# Patient Record
Sex: Male | Born: 1955 | Race: White | Hispanic: No | Marital: Married | Smoking: Former smoker
Health system: Southern US, Community
[De-identification: ages and names within clinical notes are randomized; demographics above are authoritative.]

## PROBLEM LIST (undated history)

## (undated) DIAGNOSIS — G4730 Sleep apnea, unspecified: Secondary | ICD-10-CM

## (undated) DIAGNOSIS — Z8719 Personal history of other diseases of the digestive system: Secondary | ICD-10-CM

## (undated) DIAGNOSIS — E039 Hypothyroidism, unspecified: Secondary | ICD-10-CM

## (undated) DIAGNOSIS — F0390 Unspecified dementia without behavioral disturbance: Secondary | ICD-10-CM

## (undated) DIAGNOSIS — M1990 Unspecified osteoarthritis, unspecified site: Secondary | ICD-10-CM

## (undated) DIAGNOSIS — C801 Malignant (primary) neoplasm, unspecified: Secondary | ICD-10-CM

## (undated) DIAGNOSIS — K219 Gastro-esophageal reflux disease without esophagitis: Secondary | ICD-10-CM

## (undated) DIAGNOSIS — E785 Hyperlipidemia, unspecified: Secondary | ICD-10-CM

## (undated) DIAGNOSIS — I10 Essential (primary) hypertension: Secondary | ICD-10-CM

## (undated) DIAGNOSIS — K279 Peptic ulcer, site unspecified, unspecified as acute or chronic, without hemorrhage or perforation: Secondary | ICD-10-CM

## (undated) HISTORY — PX: ROTATOR CUFF REPAIR: SHX139

## (undated) HISTORY — PX: HIATAL HERNIA REPAIR: SHX195

## (undated) HISTORY — PX: UPPER GASTROINTESTINAL ENDOSCOPY: SHX188

## (undated) HISTORY — PX: THYROIDECTOMY: SHX17

## (undated) HISTORY — PX: JOINT REPLACEMENT: SHX530

---

## 1973-09-13 HISTORY — PX: HIATAL HERNIA REPAIR: SHX195

## 2004-09-13 HISTORY — PX: COLONOSCOPY: SHX174

## 2005-01-14 ENCOUNTER — Ambulatory Visit: Payer: Self-pay | Admitting: Gastroenterology

## 2006-12-15 ENCOUNTER — Ambulatory Visit: Payer: Self-pay | Admitting: Family Medicine

## 2007-09-14 HISTORY — PX: THYROIDECTOMY: SHX17

## 2007-11-30 ENCOUNTER — Ambulatory Visit: Payer: Self-pay | Admitting: Family Medicine

## 2008-05-21 ENCOUNTER — Ambulatory Visit: Payer: Self-pay | Admitting: Otolaryngology

## 2008-05-27 ENCOUNTER — Ambulatory Visit: Payer: Self-pay | Admitting: Otolaryngology

## 2008-06-10 ENCOUNTER — Ambulatory Visit: Payer: Self-pay | Admitting: Otolaryngology

## 2008-07-09 ENCOUNTER — Inpatient Hospital Stay: Payer: Self-pay | Admitting: Endocrinology

## 2008-09-13 HISTORY — PX: TOTAL HIP ARTHROPLASTY: SHX124

## 2010-09-13 HISTORY — PX: ROTATOR CUFF REPAIR: SHX139

## 2010-12-19 ENCOUNTER — Emergency Department: Payer: Self-pay | Admitting: Emergency Medicine

## 2011-07-23 ENCOUNTER — Ambulatory Visit: Payer: Self-pay | Admitting: Family

## 2011-08-19 ENCOUNTER — Ambulatory Visit: Payer: Self-pay | Admitting: Family

## 2012-08-06 LAB — BASIC METABOLIC PANEL
BUN: 18 mg/dL (ref 7–18)
Calcium, Total: 9.1 mg/dL (ref 8.5–10.1)
EGFR (African American): >60
EGFR (Non-African Amer.): >60
Glucose: 98 mg/dL (ref 65–99)
Osmolality: 283 (ref 275–301)
Sodium: 141 mmol/L (ref 136–145)

## 2012-08-06 LAB — CBC
HCT: 40.6 % (ref 40.0–52.0)
MCHC: 35.6 g/dL (ref 32.0–36.0)
MCV: 92 fL (ref 80–100)
Platelet: 217 10*3/uL (ref 150–440)
RDW: 12 % (ref 11.5–14.5)

## 2012-08-06 LAB — CK TOTAL AND CKMB (NOT AT ARMC): CK-MB: 2.3 ng/mL (ref 0.5–3.6)

## 2012-08-07 ENCOUNTER — Observation Stay: Payer: Self-pay | Admitting: Internal Medicine

## 2012-08-07 LAB — CK TOTAL AND CKMB (NOT AT ARMC)
CK, Total: 83 U/L (ref 35–232)
CK, Total: 73 U/L (ref 35–232)
CK-MB: 1.3 ng/mL (ref 0.5–3.6)

## 2012-08-07 LAB — MAGNESIUM: Magnesium: 2.1 mg/dL

## 2012-08-07 LAB — TROPONIN I: Troponin-I: <0.02 ng/mL

## 2012-08-07 LAB — TSH: Thyroid Stimulating Horm: 0.072 u[IU]/mL — ABNORMAL LOW

## 2013-07-02 ENCOUNTER — Ambulatory Visit: Payer: Self-pay | Admitting: Internal Medicine

## 2013-07-25 ENCOUNTER — Ambulatory Visit: Payer: Self-pay | Admitting: Internal Medicine

## 2014-08-09 ENCOUNTER — Ambulatory Visit: Payer: Self-pay | Admitting: Otolaryngology

## 2014-12-31 NOTE — Discharge Summary (Signed)
PATIENT NAME:  Dylan Chen, Dylan Chen MR#:  [id number] DATE OF BIRTH:  25-Sep-1955  DATE OF ADMISSION:  08/07/2012 DATE OF DISCHARGE:  08/07/2012  PRIMARY CARE PHYSICIAN: Dr. Dorann Ou   REASON FOR ADMISSION: Chest pain.    DISCHARGE DIAGNOSES:  1. Atypical chest pain.  2. Hyperlipidemia.   CONDITION: Stable.   CODE STATUS: FULL CODE.   HOME MEDICATIONS: Please refer to the [facility] physician discharge medication list.   DIET: Low fat, low cholesterol diet.   ACTIVITY: As tolerated.   FOLLOW-UP CARE: Follow-up with PCP within 1 to 2 weeks.   HOSPITAL COURSE: The patient is a 59 year old Caucasian male with a history of hyperlipidemia, gastroesophageal reflux disease, thyroid cancer, and osteoarthritis who presented to the ED with chest pain for 30 minutes which became progressively worse so he came to the ED for further evaluation. For detailed history and physical examination, please refer to the admission note dictated by Dr. Pearletha Furl. The patient's chest x-ray was unremarkable. Troponin level was negative twice. The patient's chemistry is unremarkable. The patient underwent stress test today which is negative. The patient had no chest pain after admission but has mild tenderness on the left chest wall which is atypical so the patient is clinically stable. Vital signs stable. Physical examination unremarkable. He will be discharged to home today.   I discussed the patient's discharge plan with the patient and the patient's wife.   TIME SPENT: About 32 minutes.   ____________________________ Demetrios Loll, MD qc:drc D: 08/07/2012 17:24:05 ET T: 08/08/2012 13:32:34 ET JOB#: [id number]  cc: Demetrios Loll, MD, <Dictator> Demetrios Loll MD ELECTRONICALLY SIGNED 08/08/2012 17:08

## 2015-01-03 NOTE — H&P (Signed)
PATIENT NAME:  Dylan Chen, Dylan Chen MR#:  [id number] DATE OF BIRTH:  1956-05-15  DATE OF ADMISSION:  08/07/2012  PRIMARY CARE PROVIDER: Dr. Lavera Guise   ER PHYSICIAN: Dr. Renard Hamper    ADMITTING PHYSICIAN: Dr. Pearletha Furl    PRESENTING COMPLAINT: Chest pain, recurrent, for the last three days.   HISTORY OF PRESENT ILLNESS: The patient is a 59 year old male who was in his usual state of health until about three days when he started having recurrent left-sided chest pain. Pain was intermittent in nature, duration more than 30 minutes. Initially intensity was 5/10 but became progressively worse this evening with intensity of 9/10 associated with crying as per the patient's wife for which he presented to the Emergency Room. Pain is dull in character radiating up to the left arm and left side of the chest. Denies any PND, orthopnea, or pedal edema. No nausea or vomiting. No palpitations, diaphoresis, loss of consciousness. No history of trauma to the area or rash to the area of the chest. Denies any associated pain with activity or deep inspiration. For this he presented to the Emergency Room where he received medication which relieved the pain and he was referred to the hospitalist. Work-up so far has been negative except for EKG which showed sinus bradycardia, rate of 55. Chest x-ray is nondiagnostic. Blood pressure was normal with normal cardiac enzymes.   REVIEW OF SYSTEMS: CONSTITUTIONAL: Denies any fever, fatigue, weakness, or weight loss. EYES: No blurred vision, redness, or discharge. ENT: No tinnitus, epistaxis, difficulty swallowing. RESPIRATORY: No cough or shortness of breath. CARDIOVASCULAR: Positive for the chest pain but no arrhythmia, exertional dyspnea, palpitations, or syncope. GI: No nausea, vomiting, or diarrhea. No abdominal pain. The patient is currently hungry and wants to eat. No change in bowel habits. GU: No dysuria, frequency, or incontinence. ENDOCRINE: No polyuria, polydipsia, heat or cold  intolerance, or excessive thirst. HEMATOLOGIC: No anemia, easy bruising, bleeding, or swollen glands. SKIN: No rashes, change in hair or skin texture. MUSCULOSKELETAL: No joint pain, redness, swelling, or limited activity. NEUROLOGIC: No numbness, vertigo, dementia, or headache. PSYCHIATRIC: No anxiety or depression.    PAST MEDICAL HISTORY:  1. Gastroesophageal reflux disease. 2. Hyperlipidemia.  3. Osteoarthritis.  4. History of thyroid cancer, status post resection in 2009.   PAST SURGICAL HISTORY:  1. Right rotator cuff surgery. 2. Left hip replacement.  3. Thyroidectomy.  4. Hernia repair.   SOCIAL HISTORY: Lives at home with his family. Used to work in a Writer as dye cast. No alcohol, tobacco, or recreational drug use. Quit smoking more than 20 years ago.   FAMILY HISTORY: Positive for diabetes in maternal grandmother, otherwise negative.   ALLERGIES: Codeine makes him confused, not a true allergy.   HOME MEDICATIONS:  1. Aspirin 81 mg daily.  2. Ibuprofen 200 mg p.r.n. daily.  3. Lipitor, unknown dose, 1 tablet daily.  4. Omeprazole, unknown dose, daily.  5. Synthroid 175 mcg daily. 6. Multivitamin 1 tablet daily.  7. Calcium with Vitamin C 1 tablet daily.   PHYSICAL EXAMINATION:   VITAL SIGNS: Temperature 97.5, pulse 55, respiratory rate 18, blood pressure 107/72, sats 97 on room air.   GENERAL: Middle-aged male lying on the gurney awake, alert, oriented to time, place and person in no overt distress.   HEENT: Atraumatic, normocephalic. Pupils equal, reactive to light and accommodation. Extraocular movements intact. Mucous membranes pink, moist.   NECK: Supple. No JV distention.   CHEST: Good air entry. Clear to auscultation. No chest wall  tenderness.   HEART: Regular rate and rhythm. No murmur.   ABDOMEN: Full, moves with respiration, nontender. Bowel sounds normoactive. No organomegaly.   EXTREMITIES: No edema, clubbing, or deformity.   NEUROLOGICAL:  No focal motor or sensory deficits.   PSYCH: Affect appropriate to situation.   LABORATORY, DIAGNOSTIC, AND RADIOLOGICAL DATA: EKG shows sinus bradycardia, rate of 55. No acute ST-T wave changes.   Chest x-ray no obvious infiltrate.   CBC white count 5, hemoglobin 14, platelets 217. Chemistry unremarkable. Creatinine 1.15, potassium 3.5, glucose 98. CK 118. Troponin negative.   IMPRESSION:  1. Chest pain, rule out arrhythmia versus acute coronary syndrome, atypical presentation.  2. Gastroesophageal reflux disease, stable.  3. Hyperlipidemia, stable. 4. Osteoarthritis, stable.  5. History of thyroid cancer.   PLAN:  1. Admit to general medical floor under observation status for serial cardiac enzymes.  2. Check TSH, magnesium, fasting lipid profile, telemonitoring.  3. Stress test in a.m. if enzymes negative.  4. Aspirin p.r.n.  5. Nitroglycerin and morphine. 6. Lovenox for anticoagulation.  7. GI prophylaxis with Protonix.  8. DVT prophylaxis and Lovenox therapeutic dose at this time.  9. Check D-dimer.  CODE STATUS: FULL CODE.   TOTAL PATIENT CARE TIME: 55 minutes.   ____________________________ Jules Husbands Pearletha Furl, MD mia:drc D: 08/07/2012 03:15:35 ET T: 08/07/2012 09:14:21 ET JOB#: [id number]  cc: Daniesha Driver I. Pearletha Furl, MD, <Dictator> Cletis Athens, MD Carola Frost MD ELECTRONICALLY SIGNED 09/16/2012 23:53

## 2016-04-10 ENCOUNTER — Emergency Department
Admission: EM | Admit: 2016-04-10 | Discharge: 2016-04-10 | Disposition: A | Discharge status: Home / Self Care | Payer: PRIVATE HEALTH INSURANCE | Attending: Emergency Medicine | Admitting: Emergency Medicine

## 2016-04-10 DIAGNOSIS — Z87891 Personal history of nicotine dependence: Secondary | ICD-10-CM | POA: Diagnosis not present

## 2016-04-10 DIAGNOSIS — Y929 Unspecified place or not applicable: Secondary | ICD-10-CM | POA: Diagnosis not present

## 2016-04-10 DIAGNOSIS — Z85850 Personal history of malignant neoplasm of thyroid: Secondary | ICD-10-CM | POA: Insufficient documentation

## 2016-04-10 DIAGNOSIS — Y999 Unspecified external cause status: Secondary | ICD-10-CM | POA: Insufficient documentation

## 2016-04-10 DIAGNOSIS — W268XXA Contact with other sharp object(s), not elsewhere classified, initial encounter: Secondary | ICD-10-CM | POA: Diagnosis not present

## 2016-04-10 DIAGNOSIS — S59911A Unspecified injury of right forearm, initial encounter: Secondary | ICD-10-CM

## 2016-04-10 DIAGNOSIS — Y939 Activity, unspecified: Secondary | ICD-10-CM | POA: Diagnosis not present

## 2016-04-10 DIAGNOSIS — S50851A Superficial foreign body of right forearm, initial encounter: Secondary | ICD-10-CM | POA: Insufficient documentation

## 2016-04-10 HISTORY — DX: Malignant (primary) neoplasm, unspecified: C80.1

## 2016-04-10 HISTORY — DX: Hyperlipidemia, unspecified: E78.5

## 2016-04-10 MED ORDER — CLINDAMYCIN HCL 150 MG PO CAPS
300.0000 mg | ORAL_CAPSULE | Freq: Once | ORAL | Status: AC
  Administered 2016-04-10: 300 mg via ORAL
  Filled 2016-04-10: qty 2

## 2016-04-10 MED ORDER — CLINDAMYCIN HCL 300 MG PO CAPS: 300.0000 mg | ORAL_CAPSULE | Freq: Four times a day (QID) | ORAL | 0 refills | Status: DC

## 2016-04-10 MED ORDER — LIDOCAINE HCL (PF) 1 % IJ SOLN
5.0000 mL | Freq: Once | INTRAMUSCULAR | Status: AC
  Administered 2016-04-10: 5 mL
  Filled 2016-04-10: qty 5

## 2016-04-10 MED ORDER — TETANUS-DIPHTH-ACELL PERTUSSIS 5-2.5-18.5 LF-MCG/0.5 IM SUSP
0.5000 mL | Freq: Once | INTRAMUSCULAR | Status: AC
  Administered 2016-04-10: 0.5 mL via INTRAMUSCULAR
  Filled 2016-04-10: qty 0.5

## 2016-04-10 NOTE — ED Notes (Signed)

## 2016-04-10 NOTE — Discharge Instructions (Signed)
Take the antibiotics as directed. Keep the wound clean, dry, and covered. Return to the ED as needed for wound check.

## 2016-04-10 NOTE — ED Triage Notes (Signed)
Pt states he was fishing with his son and has 2 fish hocks in the right elbow.Dylan Chen

## 2016-04-10 NOTE — ED Provider Notes (Signed)
[facility] Emergency Department Provider Note ____________________________________________  Time seen: 1418  I have reviewed the triage vital signs and the nursing notes.  HISTORY  Chief Complaint  Foreign Body  HPI Dylan Chen is a 60 y.o. male presents to the ED accompanied by his wife for evaluation and management of a retained foreign body. The patient was at the [place] with his son when he was accidentally impaled to the right elbow with 2 treble hooks. He reports with 2 fishing hooks impaled in his right elbow superficially. He denies any other injury at this time. He is unclear of his current tetanus status.  Past Medical History:  Diagnosis Date  . Cancer ([place])    thyr  . Hyperlipemia     There are no active problems to display for this patient.   Past Surgical History:  Procedure Laterality Date  . JOINT REPLACEMENT    . ROTATOR CUFF REPAIR    . THYROIDECTOMY      Current Outpatient Rx  . Order #: [id number] Class: Print    Allergies Review of patient's allergies indicates no known allergies.  No family history on file.  Social History Social History  Substance Use Topics  . Smoking status: Former Research scientist (life sciences)  . Smokeless tobacco: Never Used  . Alcohol use No   Review of Systems  Constitutional: Negative for fever. Cardiovascular: Negative for chest pain. Respiratory: Negative for shortness of breath. Musculoskeletal: Negative for back pain. Skin: Negative for rash. Foreign body impaled superficially as above. Neurological: Negative for headaches, focal weakness or numbness. ____________________________________________  PHYSICAL EXAM:  VITAL SIGNS: ED Triage Vitals  Enc Vitals Group     BP 04/10/16 1558 137/79     Pulse Rate 04/10/16 1329 65     Resp 04/10/16 1329 16     Temp 04/10/16 1329 97.5 F (36.4 C)     Temp Source 04/10/16 1329 Oral     SpO2 04/10/16 1329 96 %     Weight 04/10/16 1329 172 lb (78 kg)   Height 04/10/16 1329 5\' 5"  (1.651 m)     Head Circumference --      Peak Flow --      Pain Score 04/10/16 1330 2     Pain Loc --      Pain Edu? --      Excl. in [place]? --    Constitutional: Alert and oriented. Well appearing and in no distress. Head: Normocephalic and atraumatic. Cardiovascular: Normal rate, regular rhythm.  Respiratory: Normal respiratory effort.  Musculoskeletal: Nontender with normal range of motion in all extremities.  Neurologic:  Normal gait without ataxia. Normal speech and language. No gross focal neurologic deficits are appreciated. Skin:  Skin is warm, dry and intact. No rash noted. Right elbow with 2 treble hooks noted. Each hook with a single barbed hook impaled under the skin. No active bleeding.  ____________________________________________  PROCEDURES  Tdap IM Clindamycin 300 mg PO  FOREIGN BODY REMOVAL Performed by: Melvenia Needles Authorized by: Melvenia Needles Consent: Verbal consent obtained. Risks and benefits: risks, benefits and alternatives were discussed Consent given by: parent/patient Patient identity confirmed: provided demographic data Prepped and Draped in normal fashion  FB Location: Right elbow  Foreign Body Identified: treble fishing hooks x 2  Removal Method: Push-through and cut barbed end  Local anesthesia: 1% lido w/o epi 3 ml total  Mechanism: electric ring cutter  Resolution: FB successfully removed  Patient tolerance: Patient tolerated the procedure  well with no immediate complications. ____________________________________________  INITIAL IMPRESSION / ASSESSMENT AND PLAN / ED COURSE  Patient with successful removal of 2 separate treble fishing hooks from the right elbow. Patient tolerated procedure well and is discharged with wound care instructions. He is placed prophylactically on clindamycin and his tetanus is boosted at this time. He will follow up with primary care provider or return to the ED  for acute signs of local infection.  Clinical Course   ____________________________________________  FINAL CLINICAL IMPRESSION(S) / ED DIAGNOSES  Final diagnoses:  Fish hook injury of right forearm, initial encounter     Melvenia Needles, PA-C 04/10/16 [place], MD 04/11/16 [phone]

## 2016-12-23 ENCOUNTER — Other Ambulatory Visit: Payer: Self-pay | Admitting: Family Medicine

## 2016-12-23 DIAGNOSIS — M7989 Other specified soft tissue disorders: Secondary | ICD-10-CM

## 2016-12-27 ENCOUNTER — Ambulatory Visit
Admission: RE | Admit: 2016-12-27 | Discharge: 2016-12-27 | Disposition: A | Discharge status: Home / Self Care | Payer: BLUE CROSS/BLUE SHIELD | Source: Ambulatory Visit | Attending: Family Medicine | Admitting: Family Medicine

## 2016-12-27 DIAGNOSIS — M7989 Other specified soft tissue disorders: Secondary | ICD-10-CM | POA: Diagnosis present

## 2016-12-30 ENCOUNTER — Other Ambulatory Visit: Payer: Self-pay | Admitting: Orthopedic Surgery

## 2016-12-30 DIAGNOSIS — M5416 Radiculopathy, lumbar region: Secondary | ICD-10-CM

## 2017-01-08 ENCOUNTER — Ambulatory Visit
Admission: RE | Admit: 2017-01-08 | Discharge: 2017-01-08 | Disposition: A | Discharge status: Home / Self Care | Payer: BLUE CROSS/BLUE SHIELD | Source: Ambulatory Visit | Attending: Orthopedic Surgery | Admitting: Orthopedic Surgery

## 2017-01-08 DIAGNOSIS — M5416 Radiculopathy, lumbar region: Secondary | ICD-10-CM | POA: Diagnosis present

## 2017-01-08 DIAGNOSIS — M5126 Other intervertebral disc displacement, lumbar region: Secondary | ICD-10-CM | POA: Insufficient documentation

## 2017-01-08 DIAGNOSIS — M5136 Other intervertebral disc degeneration, lumbar region: Secondary | ICD-10-CM | POA: Diagnosis present

## 2017-04-18 ENCOUNTER — Encounter: Payer: Self-pay | Admitting: *Deleted

## 2017-04-18 ENCOUNTER — Telehealth: Payer: Self-pay | Admitting: *Deleted

## 2017-04-18 NOTE — Telephone Encounter (Signed)
Left message for patient to call the office back. We got a referral from [place] office to see Dr.Byrnett. He has [place] which will be out of network benefits. We can still see him but let him know that he may have to pay higher out of pocket

## 2017-04-27 ENCOUNTER — Ambulatory Visit: Payer: Self-pay | Admitting: General Surgery

## 2017-05-09 ENCOUNTER — Ambulatory Visit: Payer: Self-pay | Admitting: General Surgery

## 2017-06-10 ENCOUNTER — Encounter
Admission: RE | Admit: 2017-06-10 | Discharge: 2017-06-10 | Disposition: A | Discharge status: Home / Self Care | Payer: BLUE CROSS/BLUE SHIELD | Source: Ambulatory Visit | Attending: Surgery | Admitting: Surgery

## 2017-06-10 DIAGNOSIS — R001 Bradycardia, unspecified: Secondary | ICD-10-CM | POA: Insufficient documentation

## 2017-06-10 DIAGNOSIS — I10 Essential (primary) hypertension: Secondary | ICD-10-CM | POA: Insufficient documentation

## 2017-06-10 DIAGNOSIS — Z01810 Encounter for preprocedural cardiovascular examination: Secondary | ICD-10-CM | POA: Insufficient documentation

## 2017-06-10 HISTORY — DX: Personal history of other diseases of the digestive system: Z87.19

## 2017-06-10 HISTORY — DX: Unspecified osteoarthritis, unspecified site: M19.90

## 2017-06-10 HISTORY — DX: Essential (primary) hypertension: I10

## 2017-06-10 HISTORY — DX: Hypothyroidism, unspecified: E03.9

## 2017-06-10 HISTORY — DX: Sleep apnea, unspecified: G47.30

## 2017-06-10 HISTORY — DX: Gastro-esophageal reflux disease without esophagitis: K21.9

## 2017-06-10 NOTE — Patient Instructions (Signed)
Your procedure is scheduled on: 06-16-17 THURSDAY Report to Same Day Surgery 2nd floor medical mall [facility] Entrance-take elevator on left to 2nd floor.  Check in with surgery information desk.) To find out your arrival time please call [phone] between 1PM - 3PM on 06-15-17 [facility]  Remember: Instructions that are not followed completely may result in serious medical risk, up to and including death, or upon the discretion of your surgeon and anesthesiologist your surgery may need to be rescheduled.    _x___ 1. Do not eat food after midnight the night before your procedure. NO GUM CHEWING OR HARD CANDIES.  You may drink clear liquids up to 2 hours before you are scheduled to arrive at the hospital for your procedure.  Do not drink clear liquids within 2 hours of your scheduled arrival to the hospital.  Clear liquids include  --Water or Apple juice without pulp  --Clear carbohydrate beverage such as ClearFast or Gatorade  --Black Coffee or Clear Tea (No milk, no creamers, do not add anything to the coffee or Tea  Type 1 and type 2 diabetics should only drink water.    __x__ 2. No Alcohol for 24 hours before or after surgery.   __x__3. No Smoking for 24 prior to surgery.   ____  4. Bring all medications with you on the day of surgery if instructed.    __x__ 5. Notify your doctor if there is any change in your medical condition     (cold, fever, infections).     Do not wear jewelry, make-up, hairpins, clips or nail polish.  Do not wear lotions, powders, or perfumes. You may wear deodorant.  Do not shave 48 hours prior to surgery. Men may shave face and neck.  Do not bring valuables to the hospital.    [facility] is not responsible for any belongings or valuables.               Contacts, dentures or bridgework may not be worn into surgery.  Leave your suitcase in the car. After surgery it may be brought to your room.  For patients admitted to the hospital, discharge time  is determined by your treatment team.   Patients discharged the day of surgery will not be allowed to drive home.  You will need someone to drive you home and stay with you the night of your procedure.    Please read over the following fact sheets that you were given:   [facility] Preparing for Surgery and or MRSA Information   _x___ Take anti-hypertensive listed below, cardiac, seizure, asthma,     anti-reflux and psychiatric medicines. These include:  1. SYNTHROID  2. ZANTAC  3.  4.  5.  6.  ____Fleets enema or Magnesium Citrate as directed.   _x___ Use CHG Soap or sage wipes as directed on instruction sheet   ____ Use inhalers on the day of surgery and bring to hospital day of surgery  ____ Stop Metformin and Janumet 2 days prior to surgery.    ____ Take 1/2 of usual insulin dose the night before surgery and none on the morning surgery.   _x___ Follow recommendations from Cardiologist, Pulmonologist or PCP regarding stopping Aspirin, Coumadin, Plavix ,Eliquis, Effient, or Pradaxa, and Pletal-PT STOPPED ASPIRIN ON 06-09-17  X____Stop Anti-inflammatories such as Advil, Aleve, Ibuprofen, Motrin, Naproxen, Naprosyn, Goodies powders or aspirin products. PER DR SMITHS INSTRUCTIONS, STOP MELOXICAM 48 HOURS PRIOR TO SURGERYOK to take Tylenol .   _x___ Stop supplements  until after surgery-STOP VITAMIN E AND SAW PALMETTO NOW-MAY RESUME AFTER SURGERY   _X___ Bring C-Pap to the hospital.

## 2017-06-13 ENCOUNTER — Encounter
Admission: RE | Admit: 2017-06-13 | Discharge: 2017-06-13 | Disposition: A | Discharge status: Home / Self Care | Payer: BLUE CROSS/BLUE SHIELD | Source: Ambulatory Visit | Attending: Surgery | Admitting: Surgery

## 2017-06-13 DIAGNOSIS — R001 Bradycardia, unspecified: Secondary | ICD-10-CM | POA: Diagnosis not present

## 2017-06-13 DIAGNOSIS — Z01810 Encounter for preprocedural cardiovascular examination: Secondary | ICD-10-CM | POA: Diagnosis present

## 2017-06-13 DIAGNOSIS — I10 Essential (primary) hypertension: Secondary | ICD-10-CM | POA: Diagnosis not present

## 2017-06-15 MED ORDER — CEFAZOLIN SODIUM-DEXTROSE 2-4 GM/100ML-% IV SOLN
2.0000 g | Freq: Once | INTRAVENOUS | Status: AC
  Administered 2017-06-16: 2 g via INTRAVENOUS

## 2017-06-16 ENCOUNTER — Ambulatory Visit: Payer: BLUE CROSS/BLUE SHIELD | Admitting: Registered Nurse

## 2017-06-16 ENCOUNTER — Encounter
Admission: RE | Disposition: A | Discharge status: Home / Self Care | Payer: Self-pay | Source: Ambulatory Visit | Attending: Surgery

## 2017-06-16 ENCOUNTER — Ambulatory Visit
Admission: RE | Admit: 2017-06-16 | Discharge: 2017-06-16 | Disposition: A | Discharge status: Home / Self Care | Payer: BLUE CROSS/BLUE SHIELD | Source: Ambulatory Visit | Attending: Surgery | Admitting: Surgery

## 2017-06-16 DIAGNOSIS — E890 Postprocedural hypothyroidism: Secondary | ICD-10-CM | POA: Diagnosis not present

## 2017-06-16 DIAGNOSIS — Z7982 Long term (current) use of aspirin: Secondary | ICD-10-CM | POA: Insufficient documentation

## 2017-06-16 DIAGNOSIS — Z791 Long term (current) use of non-steroidal anti-inflammatories (NSAID): Secondary | ICD-10-CM | POA: Diagnosis not present

## 2017-06-16 DIAGNOSIS — E785 Hyperlipidemia, unspecified: Secondary | ICD-10-CM | POA: Diagnosis not present

## 2017-06-16 DIAGNOSIS — I10 Essential (primary) hypertension: Secondary | ICD-10-CM | POA: Diagnosis not present

## 2017-06-16 DIAGNOSIS — K219 Gastro-esophageal reflux disease without esophagitis: Secondary | ICD-10-CM | POA: Insufficient documentation

## 2017-06-16 DIAGNOSIS — K4090 Unilateral inguinal hernia, without obstruction or gangrene, not specified as recurrent: Secondary | ICD-10-CM | POA: Diagnosis not present

## 2017-06-16 DIAGNOSIS — J449 Chronic obstructive pulmonary disease, unspecified: Secondary | ICD-10-CM | POA: Insufficient documentation

## 2017-06-16 DIAGNOSIS — Z9989 Dependence on other enabling machines and devices: Secondary | ICD-10-CM | POA: Diagnosis not present

## 2017-06-16 DIAGNOSIS — G4730 Sleep apnea, unspecified: Secondary | ICD-10-CM | POA: Diagnosis not present

## 2017-06-16 DIAGNOSIS — Z96642 Presence of left artificial hip joint: Secondary | ICD-10-CM | POA: Insufficient documentation

## 2017-06-16 DIAGNOSIS — Z87891 Personal history of nicotine dependence: Secondary | ICD-10-CM | POA: Insufficient documentation

## 2017-06-16 DIAGNOSIS — Z79899 Other long term (current) drug therapy: Secondary | ICD-10-CM | POA: Insufficient documentation

## 2017-06-16 DIAGNOSIS — D176 Benign lipomatous neoplasm of spermatic cord: Secondary | ICD-10-CM | POA: Diagnosis not present

## 2017-06-16 HISTORY — PX: INGUINAL HERNIA REPAIR: SHX194

## 2017-06-16 SURGERY — REPAIR, HERNIA, INGUINAL, ADULT
Anesthesia: General | Site: Abdomen | Laterality: Left | Wound class: Clean

## 2017-06-16 MED ORDER — DEXAMETHASONE SODIUM PHOSPHATE 10 MG/ML IJ SOLN
INTRAMUSCULAR | Status: DC | PRN
  Administered 2017-06-16: 10 mg via INTRAVENOUS

## 2017-06-16 MED ORDER — ACETAMINOPHEN 10 MG/ML IV SOLN
INTRAVENOUS | Status: AC
  Filled 2017-06-16: qty 100

## 2017-06-16 MED ORDER — GLYCOPYRROLATE 0.2 MG/ML IJ SOLN
INTRAMUSCULAR | Status: DC | PRN
  Administered 2017-06-16: 0.2 mg via INTRAVENOUS

## 2017-06-16 MED ORDER — HYDROCODONE-ACETAMINOPHEN 5-325 MG PO TABS
1.0000 | ORAL_TABLET | ORAL | Status: DC | PRN
  Administered 2017-06-16: 1 via ORAL

## 2017-06-16 MED ORDER — GLYCOPYRROLATE 0.2 MG/ML IJ SOLN
INTRAMUSCULAR | Status: AC
  Filled 2017-06-16: qty 1

## 2017-06-16 MED ORDER — EPHEDRINE SULFATE 50 MG/ML IJ SOLN
INTRAMUSCULAR | Status: DC | PRN
  Administered 2017-06-16 (×2): 10 mg via INTRAVENOUS

## 2017-06-16 MED ORDER — MIDAZOLAM HCL 2 MG/2ML IJ SOLN
INTRAMUSCULAR | Status: AC
  Filled 2017-06-16: qty 2

## 2017-06-16 MED ORDER — ONDANSETRON HCL 4 MG/2ML IJ SOLN
INTRAMUSCULAR | Status: DC | PRN
  Administered 2017-06-16: 4 mg via INTRAVENOUS

## 2017-06-16 MED ORDER — MIDAZOLAM HCL 2 MG/2ML IJ SOLN
INTRAMUSCULAR | Status: DC | PRN
  Administered 2017-06-16: 2 mg via INTRAVENOUS

## 2017-06-16 MED ORDER — ACETAMINOPHEN 10 MG/ML IV SOLN
INTRAVENOUS | Status: DC | PRN
  Administered 2017-06-16: 1000 mg via INTRAVENOUS

## 2017-06-16 MED ORDER — PROPOFOL 10 MG/ML IV BOLUS
INTRAVENOUS | Status: AC
  Filled 2017-06-16: qty 20

## 2017-06-16 MED ORDER — LIDOCAINE HCL (PF) 2 % IJ SOLN
INTRAMUSCULAR | Status: AC
  Filled 2017-06-16: qty 6

## 2017-06-16 MED ORDER — FENTANYL CITRATE (PF) 100 MCG/2ML IJ SOLN
INTRAMUSCULAR | Status: AC
  Filled 2017-06-16: qty 2

## 2017-06-16 MED ORDER — FENTANYL CITRATE (PF) 100 MCG/2ML IJ SOLN
INTRAMUSCULAR | Status: DC | PRN
  Administered 2017-06-16 (×2): 50 ug via INTRAVENOUS

## 2017-06-16 MED ORDER — BUPIVACAINE-EPINEPHRINE (PF) 0.5% -1:200000 IJ SOLN
INTRAMUSCULAR | Status: AC
  Filled 2017-06-16: qty 30

## 2017-06-16 MED ORDER — BUPIVACAINE-EPINEPHRINE (PF) 0.5% -1:200000 IJ SOLN
INTRAMUSCULAR | Status: DC | PRN
  Administered 2017-06-16: 11 mL

## 2017-06-16 MED ORDER — ONDANSETRON HCL 4 MG/2ML IJ SOLN
INTRAMUSCULAR | Status: AC
  Filled 2017-06-16: qty 2

## 2017-06-16 MED ORDER — LACTATED RINGERS IV SOLN
INTRAVENOUS | Status: DC
  Administered 2017-06-16 (×2): via INTRAVENOUS

## 2017-06-16 MED ORDER — ROCURONIUM BROMIDE 50 MG/5ML IV SOLN
INTRAVENOUS | Status: AC
  Filled 2017-06-16: qty 1

## 2017-06-16 MED ORDER — FENTANYL CITRATE (PF) 100 MCG/2ML IJ SOLN: 25.0000 ug | INTRAMUSCULAR | Status: DC | PRN

## 2017-06-16 MED ORDER — HYDROCODONE-ACETAMINOPHEN 5-325 MG PO TABS
ORAL_TABLET | ORAL | Status: AC
  Filled 2017-06-16: qty 1

## 2017-06-16 MED ORDER — PROPOFOL 10 MG/ML IV BOLUS
INTRAVENOUS | Status: DC | PRN
  Administered 2017-06-16: 150 mg via INTRAVENOUS
  Administered 2017-06-16: 50 mg via INTRAVENOUS

## 2017-06-16 MED ORDER — LIDOCAINE HCL (CARDIAC) 20 MG/ML IV SOLN
INTRAVENOUS | Status: DC | PRN
  Administered 2017-06-16: 80 mg via INTRAVENOUS

## 2017-06-16 MED ORDER — ONDANSETRON HCL 4 MG/2ML IJ SOLN: 4.0000 mg | Freq: Once | INTRAMUSCULAR | Status: DC | PRN

## 2017-06-16 MED ORDER — DEXAMETHASONE SODIUM PHOSPHATE 10 MG/ML IJ SOLN
INTRAMUSCULAR | Status: AC
  Filled 2017-06-16: qty 1

## 2017-06-16 MED ORDER — CEFAZOLIN SODIUM-DEXTROSE 2-4 GM/100ML-% IV SOLN
INTRAVENOUS | Status: AC
  Filled 2017-06-16: qty 100

## 2017-06-16 MED ORDER — HYDROCODONE-ACETAMINOPHEN 5-325 MG PO TABS: 1.0000 | ORAL_TABLET | Freq: Four times a day (QID) | ORAL | 0 refills | Status: DC | PRN

## 2017-06-16 SURGICAL SUPPLY — 30 items
BARD SOFT MESH ×2 IMPLANT
BLADE SURG 15 STRL LF DISP TIS (BLADE) ×1 IMPLANT
BLADE SURG 15 STRL SS (BLADE) ×1
CANISTER SUCT 1200ML W/VALVE (MISCELLANEOUS) ×2 IMPLANT
CHLORAPREP W/TINT 26ML (MISCELLANEOUS) ×2 IMPLANT
DERMABOND ADVANCED (GAUZE/BANDAGES/DRESSINGS) ×1
DERMABOND ADVANCED .7 DNX12 (GAUZE/BANDAGES/DRESSINGS) ×1 IMPLANT
DRAIN PENROSE 5/8X18 LTX STRL (WOUND CARE) ×2 IMPLANT
DRAPE LAPAROTOMY 77X122 PED (DRAPES) ×2 IMPLANT
ELECT REM PT RETURN 9FT ADLT (ELECTROSURGICAL) ×2
ELECTRODE REM PT RTRN 9FT ADLT (ELECTROSURGICAL) ×1 IMPLANT
GLOVE BIO SURGEON STRL SZ7.5 (GLOVE) ×2 IMPLANT
GLOVE BIOGEL M STER SZ 6 (GLOVE) ×4 IMPLANT
GLOVE BIOGEL PI IND STRL 6.5 (GLOVE) ×2 IMPLANT
GLOVE BIOGEL PI INDICATOR 6.5 (GLOVE) ×2
GOWN STRL REUS W/ TWL LRG LVL3 (GOWN DISPOSABLE) ×3 IMPLANT
GOWN STRL REUS W/TWL LRG LVL3 (GOWN DISPOSABLE) ×3
KIT RM TURNOVER STRD PROC AR (KITS) ×2 IMPLANT
LABEL OR SOLS (LABEL) ×2 IMPLANT
MESH SYNTHETIC 4X6 SOFT BARD (Mesh General) ×1 IMPLANT
MESH SYNTHETIC SOFT BARD 4X6 (Mesh General) ×1 IMPLANT
NEEDLE HYPO 25X1 1.5 SAFETY (NEEDLE) ×2 IMPLANT
NS IRRIG 500ML POUR BTL (IV SOLUTION) ×2 IMPLANT
PACK BASIN MINOR ARMC (MISCELLANEOUS) ×2 IMPLANT
SUT CHROMIC 4 0 RB 1X27 (SUTURE) ×2 IMPLANT
SUT MNCRL AB 4-0 PS2 18 (SUTURE) ×2 IMPLANT
SUT SURGILON 0 30 BLK (SUTURE) ×4 IMPLANT
SUT VIC AB 4-0 SH 27 (SUTURE) ×1
SUT VIC AB 4-0 SH 27XANBCTRL (SUTURE) ×1 IMPLANT
SYRINGE 10CC LL (SYRINGE) ×2 IMPLANT

## 2017-06-16 NOTE — Anesthesia Postprocedure Evaluation (Signed)
Anesthesia Post Note  Patient: Dylan Chen  Procedure(s) Performed: HERNIA REPAIR INGUINAL ADULT (Left Abdomen)  Patient location during evaluation: PACU Anesthesia Type: General Level of consciousness: awake and alert Pain management: pain level controlled Vital Signs Assessment: post-procedure vital signs reviewed and stable Respiratory status: spontaneous breathing and respiratory function stable Cardiovascular status: stable Anesthetic complications: no     Last Vitals:  Vitals:   06/16/17 1104 06/16/17 1119  BP: (!) 107/58 110/67  Pulse: 65 61  Resp: 14 13  Temp: 36.6 C   SpO2: (!) 6% 100%    Last Pain:  Vitals:   06/16/17 1119  TempSrc:   PainSc: 0-No pain                 Jayel Inks K

## 2017-06-16 NOTE — Anesthesia Post-op Follow-up Note (Signed)
Anesthesia QCDR form completed.        

## 2017-06-16 NOTE — OR Nursing (Signed)
Dr. Tamala Julian in to see patient @ 12:37 pm

## 2017-06-16 NOTE — Anesthesia Preprocedure Evaluation (Signed)
Anesthesia Evaluation  Patient identified by MRN, date of birth, ID band Patient awake    Reviewed: Allergy & Precautions, NPO status , Patient's Chart, lab work & pertinent test results  History of Anesthesia Complications Negative for: history of anesthetic complications  Airway Mallampati: I       Dental  (+) Chipped, Missing   Pulmonary sleep apnea and Continuous Positive Airway Pressure Ventilation , neg COPD, former smoker,           Cardiovascular hypertension, Pt. on medications      Neuro/Psych neg Seizures    GI/Hepatic Neg liver ROS, hiatal hernia, GERD  Medicated and Controlled,  Endo/Other  neg diabetesHypothyroidism   Renal/GU negative Renal ROS     Musculoskeletal   Abdominal   Peds  Hematology   Anesthesia Other Findings   Reproductive/Obstetrics                             Anesthesia Physical Anesthesia Plan  ASA: II  Anesthesia Plan: General   Post-op Pain Management:    Induction: Intravenous  PONV Risk Score and Plan:   Airway Management Planned: Oral ETT and LMA  Additional Equipment:   Intra-op Plan:   Post-operative Plan:   Informed Consent: I have reviewed the patients History and Physical, chart, labs and discussed the procedure including the risks, benefits and alternatives for the proposed anesthesia with the patient or authorized representative who has indicated his/her understanding and acceptance.     Plan Discussed with:   Anesthesia Plan Comments:         Anesthesia Quick Evaluation

## 2017-06-16 NOTE — Discharge Instructions (Addendum)
Take Tylenol or Norco if needed for pain.  Should not drive or do anything dangerous when taking Norco.  Avoid straining and heavy lifting.  May shower and blot dry.   AMBULATORY SURGERY  DISCHARGE INSTRUCTIONS   1) The drugs that you were given will stay in your system until tomorrow so for the next 24 hours you should not:  A) Drive an automobile B) Make any legal decisions C) Drink any alcoholic beverage   2) You may resume regular meals tomorrow.  Today it is better to start with liquids and gradually work up to solid foods.  You may eat anything you prefer, but it is better to start with liquids, then soup and crackers, and gradually work up to solid foods.   3) Please notify your doctor immediately if you have any unusual bleeding, trouble breathing, redness and pain at the surgery site, drainage, fever, or pain not relieved by medication.    4) Additional Instructions:        Please contact your physician with any problems or Same Day Surgery at [phone], Monday through Friday 6 am to 4 pm, or [place] at [facility] number at [phone].

## 2017-06-16 NOTE — Anesthesia Procedure Notes (Signed)
Procedure Name: LMA Insertion Date/Time: 06/16/2017 9:36 AM Performed by: Doreen Salvage Pre-anesthesia Checklist: Patient identified, Patient being monitored, Timeout performed, Emergency Drugs available and Suction available Patient Re-evaluated:Patient Re-evaluated prior to induction Oxygen Delivery Method: Circle system utilized Preoxygenation: Pre-oxygenation with 100% oxygen Induction Type: IV induction Ventilation: Mask ventilation without difficulty LMA: LMA inserted LMA Size: 3.5 Tube type: Oral Number of attempts: 1 Placement Confirmation: positive ETCO2 and breath sounds checked- equal and bilateral Tube secured with: Tape Dental Injury: Teeth and Oropharynx as per pre-operative assessment

## 2017-06-16 NOTE — Op Note (Signed)
OPERATIVE REPORT  PREOPERATIVE DIAGNOSIS: left inguinal hernia  POSTOPERATIVE DIAGNOSIS:left  inguinal hernia  PROCEDURE:  left inguinal hernia repair  ANESTHESIA:  General  SURGEON:  Rochel Brome M.D.  INDICATIONS:. He reported bulging in the left groin. Left inguinal hernia was demonstrated on physical exam and repair was recommended for definitive treatment.  With the patient on the operating table in the supine position the left lower quadrant was prepared with clippers and with ChloraPrep and draped in a sterile manner. A transversely oriented suprapubic incision was made and carried down through subcutaneous tissues. Electrocautery was used for hemostasis. The Scarpa's fascia was incised. The external oblique aponeurosis was incised along the course of its fibers to open the external ring and expose the inguinal cord structures. The cord structures were mobilized. A Penrose drain was passed around the cord structures for traction. Cremaster fibers were spread to expose an indirect hernia sac. The sac was dissected free from surrounding structures. The sac was opened. The continuity with the peritoneal cavity was demonstrated. A high ligation of the sac was done with a 4-0 Vicryl suture ligature. The sac was excised and was not submitted for pathology. The stump was allowed to retract. A cord lipoma approximately 7 cm in length was dissected free from surrounding structures and its vascular pedicle at the internal ring suture ligated with 3-0 Vicryl and amputated and allowed the stump to retract. This was not submitted for pathology.  Bard soft mesh was cut to create an oval shape and was placed over the repair. This was sutured to the repair with interrupted 0 Surgilon sutures and also sutured medially to the deep fascia and on both sides of the internal ring. Next after seeing hemostasis was intact the cord structures were replaced along the floor of the inguinal canal. The cut edges of the  external oblique aponeurosis were closed with a running 4-0 Vicryl suture to re-create the external ring. The deep fascia superior and lateral to the repair site was infiltrated with half percent Sensorcaine with epinephrine. Subcutaneous tissues were also infiltrated. The Scarpa's fascia was closed with interrupted 4-0 Vicryl sutures. The skin was closed with running 4-0 Monocryl subcuticular suture and Dermabond. The testicle remained in the scrotum  The patient appeared to be in satisfactory condition and was prepared for transfer to the recovery room.  Rochel Brome M.D.

## 2017-06-16 NOTE — H&P (Signed)
  He comes in today prepared for left inguinal hernia repair He reports no change in condition since his office exam.  Lab work reviewed  I discussed the plan for surgery.  The left side was marked YES

## 2017-06-16 NOTE — Transfer of Care (Signed)
Immediate Anesthesia Transfer of Care Note  Patient: Dylan Chen  Procedure(s) Performed: Procedure(s): HERNIA REPAIR INGUINAL ADULT (Left)  Patient Location: PACU  Anesthesia Type:General  Level of Consciousness: sedated  Airway & Oxygen Therapy: Patient Spontanous Breathing and Patient connected to face mask oxygen  Post-op Assessment: Report given to RN and Post -op Vital signs reviewed and stable  Post vital signs: Reviewed and stable  Last Vitals:  Vitals:   06/16/17 0813 06/16/17 1104  BP: 122/82 (!) 107/58  Pulse: (!) 51 65  Resp: 18 14  Temp: (!) 35.5 C 36.6 C  SpO2: 11% (!) 6%    Complications: No apparent anesthesia complications

## 2017-06-17 ENCOUNTER — Encounter: Payer: Self-pay | Admitting: Surgery

## 2017-12-16 ENCOUNTER — Encounter: Payer: Self-pay | Admitting: Student

## 2017-12-19 ENCOUNTER — Encounter
Admission: RE | Disposition: A | Discharge status: Home / Self Care | Payer: Self-pay | Source: Ambulatory Visit | Attending: Unknown Physician Specialty

## 2017-12-19 ENCOUNTER — Ambulatory Visit: Payer: BLUE CROSS/BLUE SHIELD | Admitting: Anesthesiology

## 2017-12-19 ENCOUNTER — Encounter: Payer: Self-pay | Admitting: Anesthesiology

## 2017-12-19 ENCOUNTER — Ambulatory Visit
Admission: RE | Admit: 2017-12-19 | Discharge: 2017-12-19 | Disposition: A | Discharge status: Home / Self Care | Payer: BLUE CROSS/BLUE SHIELD | Source: Ambulatory Visit | Attending: Unknown Physician Specialty | Admitting: Unknown Physician Specialty

## 2017-12-19 DIAGNOSIS — Z87891 Personal history of nicotine dependence: Secondary | ICD-10-CM | POA: Diagnosis not present

## 2017-12-19 DIAGNOSIS — Z7982 Long term (current) use of aspirin: Secondary | ICD-10-CM | POA: Insufficient documentation

## 2017-12-19 DIAGNOSIS — Z8371 Family history of colonic polyps: Secondary | ICD-10-CM | POA: Diagnosis not present

## 2017-12-19 DIAGNOSIS — Z79899 Other long term (current) drug therapy: Secondary | ICD-10-CM | POA: Insufficient documentation

## 2017-12-19 DIAGNOSIS — Z1211 Encounter for screening for malignant neoplasm of colon: Secondary | ICD-10-CM | POA: Diagnosis present

## 2017-12-19 DIAGNOSIS — E785 Hyperlipidemia, unspecified: Secondary | ICD-10-CM | POA: Insufficient documentation

## 2017-12-19 DIAGNOSIS — Z79890 Hormone replacement therapy (postmenopausal): Secondary | ICD-10-CM | POA: Insufficient documentation

## 2017-12-19 DIAGNOSIS — M19041 Primary osteoarthritis, right hand: Secondary | ICD-10-CM | POA: Diagnosis not present

## 2017-12-19 DIAGNOSIS — E039 Hypothyroidism, unspecified: Secondary | ICD-10-CM | POA: Insufficient documentation

## 2017-12-19 DIAGNOSIS — K5730 Diverticulosis of large intestine without perforation or abscess without bleeding: Secondary | ICD-10-CM | POA: Diagnosis not present

## 2017-12-19 DIAGNOSIS — G4730 Sleep apnea, unspecified: Secondary | ICD-10-CM | POA: Insufficient documentation

## 2017-12-19 HISTORY — DX: Peptic ulcer, site unspecified, unspecified as acute or chronic, without hemorrhage or perforation: K27.9

## 2017-12-19 HISTORY — PX: COLONOSCOPY WITH PROPOFOL: SHX5780

## 2017-12-19 SURGERY — COLONOSCOPY WITH PROPOFOL
Anesthesia: General

## 2017-12-19 MED ORDER — PROPOFOL 500 MG/50ML IV EMUL
INTRAVENOUS | Status: AC
  Filled 2017-12-19: qty 50

## 2017-12-19 MED ORDER — LIDOCAINE HCL (PF) 2 % IJ SOLN
INTRAMUSCULAR | Status: AC
  Filled 2017-12-19: qty 10

## 2017-12-19 MED ORDER — LIDOCAINE HCL (PF) 1 % IJ SOLN
INTRAMUSCULAR | Status: AC
  Administered 2017-12-19: 0.3 mL via INTRADERMAL
  Filled 2017-12-19: qty 2

## 2017-12-19 MED ORDER — EPHEDRINE SULFATE 50 MG/ML IJ SOLN
INTRAMUSCULAR | Status: AC
  Filled 2017-12-19: qty 1

## 2017-12-19 MED ORDER — LIDOCAINE HCL (PF) 1 % IJ SOLN
2.0000 mL | Freq: Once | INTRAMUSCULAR | Status: AC
  Administered 2017-12-19: 0.3 mL via INTRADERMAL

## 2017-12-19 MED ORDER — PIPERACILLIN-TAZOBACTAM 3.375 G IVPB 30 MIN
3.3750 g | Freq: Once | INTRAVENOUS | Status: AC
  Administered 2017-12-19: 3.375 g via INTRAVENOUS
  Filled 2017-12-19: qty 50

## 2017-12-19 MED ORDER — PROPOFOL 10 MG/ML IV BOLUS
INTRAVENOUS | Status: DC | PRN
  Administered 2017-12-19: 20 mg via INTRAVENOUS
  Administered 2017-12-19: 50 mg via INTRAVENOUS

## 2017-12-19 MED ORDER — LIDOCAINE HCL (CARDIAC) 20 MG/ML IV SOLN
INTRAVENOUS | Status: DC | PRN
  Administered 2017-12-19: 50 mg via INTRAVENOUS

## 2017-12-19 MED ORDER — PROPOFOL 500 MG/50ML IV EMUL
INTRAVENOUS | Status: DC | PRN
  Administered 2017-12-19: 175 ug/kg/min via INTRAVENOUS

## 2017-12-19 MED ORDER — SODIUM CHLORIDE 0.9 % IV SOLN: INTRAVENOUS | Status: DC

## 2017-12-19 MED ORDER — PIPERACILLIN-TAZOBACTAM 3.375 G IVPB
INTRAVENOUS | Status: AC
  Administered 2017-12-19: 3.375 g via INTRAVENOUS
  Filled 2017-12-19: qty 50

## 2017-12-19 MED ORDER — SODIUM CHLORIDE 0.9 % IV SOLN
INTRAVENOUS | Status: DC
  Administered 2017-12-19: 1000 mL via INTRAVENOUS

## 2017-12-19 MED ORDER — PHENYLEPHRINE HCL 10 MG/ML IJ SOLN
INTRAMUSCULAR | Status: AC
  Filled 2017-12-19: qty 1

## 2017-12-19 NOTE — Anesthesia Procedure Notes (Signed)
Date/Time: 12/19/2017 7:34 AM Performed by: Johnna Acosta, CRNA Pre-anesthesia Checklist: Patient being monitored, Suction available, Timeout performed, Patient identified and Emergency Drugs available Patient Re-evaluated:Patient Re-evaluated prior to induction Oxygen Delivery Method: Nasal cannula Preoxygenation: Pre-oxygenation with 100% oxygen

## 2017-12-19 NOTE — Op Note (Signed)
[facility] Gastroenterology Patient Name: Dylan Chen Procedure Date: 12/19/2017 7:31 AM MRN: [id number] Account #: [id number] Date of Birth: 1956-03-18 Admit Type: Outpatient Age: 62 Room: [facility] ENDO ROOM 1 Gender: Male Note Status: Finalized Procedure:            Colonoscopy Indications:          Colon cancer screening in patient at increased risk:                        Family history of 1st-degree relative with colon polyps Providers:            Manya Silvas, MD Referring MD:         Irven Easterly. Kary Kos, MD (Referring MD) Medicines:            Propofol per Anesthesia Complications:        No immediate complications. Procedure:            Pre-Anesthesia Assessment:                       - After reviewing the risks and benefits, the patient                        was deemed in satisfactory condition to undergo the                        procedure.                       After obtaining informed consent, the colonoscope was                        passed under direct vision. Throughout the procedure,                        the patient's blood pressure, pulse, and oxygen                        saturations were monitored continuously. The                        Colonoscope was introduced through the anus and                        advanced to the the cecum, identified by appendiceal                        orifice and ileocecal valve. The colonoscopy was                        performed without difficulty. The patient tolerated the                        procedure well. The quality of the bowel preparation                        was excellent. Findings:      A diminutive polyp was found in the descending colon. The polyp was       sessile. The polyp was removed with a jumbo cold forceps. Resection and       retrieval were complete.  A few medium-mouthed diverticula were found in the sigmoid colon.      A single small-mouthed diverticulum was found in the  sigmoid colon.       White exudate was around one diverticulum indicating diverticulitis.      Internal hemorrhoids were found during endoscopy. The hemorrhoids were       small and Grade I (internal hemorrhoids that do not prolapse). Impression:           - One diminutive polyp in the descending colon, removed                        with a jumbo cold forceps. Resected and retrieved.                       - Diverticulosis in the sigmoid colon.                       - Diverticulosis in the sigmoid colon.                       - Internal hemorrhoids. Recommendation:       - Await pathology results. Treat with Augmentin for                        diverticulitis. Manya Silvas, MD 12/19/2017 7:55:49 AM This report has been signed electronically. Number of Addenda: 0 Note Initiated On: 12/19/2017 7:31 AM Scope Withdrawal Time: 0 hours 8 minutes 15 seconds  Total Procedure Duration: 0 hours 12 minutes 38 seconds       [facility]

## 2017-12-19 NOTE — Transfer of Care (Signed)
Immediate Anesthesia Transfer of Care Note  Patient: Dylan Chen  Procedure(s) Performed: COLONOSCOPY WITH PROPOFOL (N/A )  Patient Location: PACU  Anesthesia Type:General  Level of Consciousness: sedated  Airway & Oxygen Therapy: Patient Spontanous Breathing and Patient connected to nasal cannula oxygen  Post-op Assessment: Report given to RN and Post -op Vital signs reviewed and stable  Post vital signs: Reviewed and stable  Last Vitals:  Vitals Value Taken Time  BP 102/69 12/19/2017  7:55 AM  Temp 36.1 C 12/19/2017  7:55 AM  Pulse 52 12/19/2017  7:56 AM  Resp 15 12/19/2017  7:56 AM  SpO2 98 % 12/19/2017  7:56 AM  Vitals shown include unvalidated device data.  Last Pain:  Vitals:   12/19/17 0755  TempSrc: Tympanic  PainSc: 0-No pain         Complications: No apparent anesthesia complications

## 2017-12-19 NOTE — Anesthesia Postprocedure Evaluation (Signed)
Anesthesia Post Note  Patient: Dylan Chen  Procedure(s) Performed: COLONOSCOPY WITH PROPOFOL (N/A )  Patient location during evaluation: Endoscopy Anesthesia Type: General Level of consciousness: awake and alert Pain management: pain level controlled Vital Signs Assessment: post-procedure vital signs reviewed and stable Respiratory status: spontaneous breathing, nonlabored ventilation, respiratory function stable and patient connected to nasal cannula oxygen Cardiovascular status: blood pressure returned to baseline and stable Postop Assessment: no apparent nausea or vomiting Anesthetic complications: no     Last Vitals:  Vitals:   12/19/17 0815 12/19/17 0825  BP: 114/74 105/78  Pulse: (!) 50 (!) 44  Resp: 13 12  Temp:    SpO2: 99% 100%    Last Pain:  Vitals:   12/19/17 0825  TempSrc:   PainSc: 0-No pain                 Estus Krakowski S

## 2017-12-19 NOTE — Anesthesia Post-op Follow-up Note (Signed)
Anesthesia QCDR form completed.        

## 2017-12-19 NOTE — H&P (Signed)
Primary Care Physician:  Maryland Pink, MD Primary Gastroenterologist:  Dr. Vira Agar  Pre-Procedure History & Physical: HPI:  Dylan Chen is a 62 y.o. male is here for an colonoscopy for family history of colon polyps.   Past Medical History:  Diagnosis Date  . Arthritis    RIGHT HAND  . Cancer (HCC)    THYROID  . GERD (gastroesophageal reflux disease)   . History of hiatal hernia   . Hyperlipemia   . Hypertension   . Hypothyroidism   . Peptic ulcer   . Sleep apnea    CPAP    Past Surgical History:  Procedure Laterality Date  . HIATAL HERNIA REPAIR    . INGUINAL HERNIA REPAIR Left 06/16/2017   Procedure: HERNIA REPAIR INGUINAL ADULT;  Surgeon: Leonie Green, MD;  Location: [facility] ORS;  Service: General;  Laterality: Left;  . JOINT REPLACEMENT Left    THR  . ROTATOR CUFF REPAIR Right   . THYROIDECTOMY      Prior to Admission medications   Medication Sig Start Date End Date Taking? Authorizing Provider  aspirin EC 81 MG tablet Take 81 mg by mouth at bedtime.   Yes [provider]  atorvastatin (LIPITOR) 20 MG tablet Take 20 mg by mouth every evening.    [provider]  Calcium Carb-Cholecalciferol (CALCIUM 600 + D PO) Take 2 tablets by mouth every evening.     [provider]  HYDROcodone-acetaminophen (NORCO) 5-325 MG tablet Take 1-2 tablets by mouth every 6 (six) hours as needed for moderate pain. 06/16/17   Leonie Green, MD  lisinopril (PRINIVIL,ZESTRIL) 10 MG tablet Take 10 mg by mouth every evening.    [provider]  meloxicam (MOBIC) 15 MG tablet Take 15 mg by mouth every evening.    [provider]  Multiple Vitamin (MULTIVITAMIN WITH MINERALS) TABS tablet Take 1 tablet by mouth every evening.    [provider]  Omega-3 Fatty Acids (FISH OIL) 1000 MG CPDR Take 1 capsule by mouth daily.    [provider]  ranitidine (ZANTAC) 150 MG capsule Take 150 mg by mouth 2 (two) times daily.  04/14/17   [provider]  saw palmetto 500 MG capsule Take 1,000 mg by mouth 2 (two) times daily.    [provider]  SYNTHROID 175 MCG tablet Take 175 mcg by mouth daily before breakfast. 04/30/17   [provider]  vitamin C (ASCORBIC ACID) 500 MG tablet Take 1,000 mg by mouth daily.     [provider]  vitamin E (VITAMIN E) 1000 UNIT capsule Take 1,000 Units by mouth daily.    [provider]  vitamin E 400 UNIT capsule Take 400 Units by mouth daily.    [provider]    Allergies as of 08/01/2017 - Review Complete 06/16/2017  Allergen Reaction Noted  . Codeine Other (See Comments) 02/13/2015    History reviewed. No pertinent family history.  Social History   Socioeconomic History  . Marital status: Married    Spouse name: Not on file  . Number of children: Not on file  . Years of education: Not on file  . Highest education level: Not on file  Occupational History  . Not on file  Social Needs  . Financial resource strain: Not on file  . Food insecurity:    Worry: Not on file    Inability: Not on file  . Transportation needs:    Medical: Not on file  Non-medical: Not on file  Tobacco Use  . Smoking status: Former Smoker    Packs/day: 2.00    Years: 40.00    Pack years: 80.00    Types: Cigarettes, Cigars    Last attempt to quit: 06/10/1997    Years since quitting: 20.5  . Smokeless tobacco: Former Network engineer and Sexual Activity  . Alcohol use: No  . Drug use: No  . Sexual activity: Not on file  Lifestyle  . Physical activity:    Days per week: Not on file    Minutes per session: Not on file  . Stress: Not on file  Relationships  . Social connections:    Talks on phone: Not on file    Gets together: Not on file    Attends religious service: Not on file    Active member of club or organization: Not on file    Attends meetings of clubs or organizations: Not on file    Relationship status: Not on file   . Intimate partner violence:    Fear of current or ex partner: Not on file    Emotionally abused: Not on file    Physically abused: Not on file    Forced sexual activity: Not on file  Other Topics Concern  . Not on file  Social History Narrative  . Not on file    Review of Systems: See HPI, otherwise negative ROS  Physical Exam: BP 125/82   Pulse (!) 49   Temp (!) 96.6 F (35.9 C) (Tympanic)   Resp 17   Ht 5\' 5"  (1.651 m)   Wt 72.6 kg (160 lb)   SpO2 97%   BMI 26.63 kg/m  General:   Alert,  pleasant and cooperative in NAD Head:  Normocephalic and atraumatic. Neck:  Supple; no masses or thyromegaly. Lungs:  Clear throughout to auscultation.    Heart:  Regular rate and rhythm. Abdomen:  Soft, nontender and nondistended. Normal bowel sounds, without guarding, and without rebound.   Neurologic:  Alert and  oriented x4;  grossly normal neurologically.  Impression/Plan: Dylan Chen is here for an colonoscopy to be performed for FH colon polyps  Risks, benefits, limitations, and alternatives regarding  colonoscopy have been reviewed with the patient.  Questions have been answered.  All parties agreeable.   Gaylyn Cheers, MD  12/19/2017, 7:23 AM

## 2017-12-19 NOTE — Anesthesia Preprocedure Evaluation (Signed)
Anesthesia Evaluation  Patient identified by MRN, date of birth, ID band Patient awake    Reviewed: Allergy & Precautions, NPO status , Patient's Chart, lab work & pertinent test results, reviewed documented beta blocker date and time   Airway Mallampati: II  TM Distance: >3 FB     Dental  (+) Chipped   Pulmonary sleep apnea , former smoker,           Cardiovascular hypertension, Pt. on medications      Neuro/Psych    GI/Hepatic hiatal hernia, PUD, GERD  ,  Endo/Other  Hypothyroidism   Renal/GU      Musculoskeletal  (+) Arthritis ,   Abdominal   Peds  Hematology   Anesthesia Other Findings   Reproductive/Obstetrics                             Anesthesia Physical Anesthesia Plan  ASA: III  Anesthesia Plan: General   Post-op Pain Management:    Induction: Intravenous  PONV Risk Score and Plan:   Airway Management Planned:   Additional Equipment:   Intra-op Plan:   Post-operative Plan:   Informed Consent: I have reviewed the patients History and Physical, chart, labs and discussed the procedure including the risks, benefits and alternatives for the proposed anesthesia with the patient or authorized representative who has indicated his/her understanding and acceptance.     Plan Discussed with: CRNA  Anesthesia Plan Comments:         Anesthesia Quick Evaluation

## 2017-12-20 ENCOUNTER — Encounter: Payer: Self-pay | Admitting: Unknown Physician Specialty

## 2017-12-20 LAB — SURGICAL PATHOLOGY

## 2020-06-23 ENCOUNTER — Other Ambulatory Visit: Payer: Self-pay | Admitting: Neurology

## 2020-06-23 ENCOUNTER — Other Ambulatory Visit (HOSPITAL_COMMUNITY): Payer: Self-pay | Admitting: Neurology

## 2020-06-23 DIAGNOSIS — G3184 Mild cognitive impairment, so stated: Secondary | ICD-10-CM

## 2020-06-23 DIAGNOSIS — E538 Deficiency of other specified B group vitamins: Secondary | ICD-10-CM

## 2020-07-09 ENCOUNTER — Ambulatory Visit (HOSPITAL_COMMUNITY)
Admission: RE | Admit: 2020-07-09 | Discharge: 2020-07-09 | Disposition: A | Discharge status: Home / Self Care | Payer: Medicare Other | Source: Ambulatory Visit | Attending: Neurology | Admitting: Neurology

## 2020-07-09 DIAGNOSIS — E538 Deficiency of other specified B group vitamins: Secondary | ICD-10-CM | POA: Insufficient documentation

## 2020-07-09 DIAGNOSIS — G3184 Mild cognitive impairment, so stated: Secondary | ICD-10-CM

## 2020-12-23 ENCOUNTER — Ambulatory Visit: Payer: Self-pay | Admitting: General Surgery

## 2020-12-23 NOTE — H&P (View-Only) (Signed)
PATIENT PROFILE: Dylan Chen is a 65 y.o. male who presents to the Clinic for consultation at the request of Dylan. Kary Chen for evaluation of right inguinal hernia.  PCP:  Dylan Macadamia, MD  HISTORY OF PRESENT ILLNESS: Dylan Chen reports feeling a hernia in the right groin since 2 months ago.  Patient reports pain in the groin.  The pain radiates to the right scrotum.  Pain is aggravated by certain activities.  Alleviating factor is reducing the hernia.  The patient's aunt and have to lay back relax and massage the right groin to be able to reduce the hernia.  He denies abdominal incision nausea or vomiting.  Patient has had open left inguinal hernia repair 4 years ago.   PROBLEM LIST:         Problem List  Date Reviewed: 12/22/2020         Noted   Hyperlipidemia Unknown   Hypertension Unknown      GENERAL REVIEW OF SYSTEMS:   General ROS: negative for - chills, fatigue, fever, weight gain or weight loss Allergy and Immunology ROS: negative for - hives  Hematological and Lymphatic ROS: negative for - bleeding problems or bruising, negative for palpable nodes Endocrine ROS: negative for - heat or cold intolerance, hair changes Respiratory ROS: negative for - cough, shortness of breath or wheezing Cardiovascular ROS: no chest pain or palpitations GI ROS: negative for nausea, vomiting, abdominal pain, diarrhea, constipation Musculoskeletal ROS: negative for - joint swelling or muscle pain Neurological ROS: negative for - confusion, syncope Dermatological ROS: negative for pruritus and rash Psychiatric: negative for anxiety, depression, difficulty sleeping.positive for memory loss  MEDICATIONS: Current Medications        Current Outpatient Medications  Medication Sig Dispense Refill  . ascorbic acid (VITAMIN C) 500 MG tablet Take 1,000 mg by mouth once daily      . aspirin 81 MG EC tablet Take 81 mg by mouth once daily.    Marland Kitchen atorvastatin (LIPITOR) 20 MG tablet  TAKE 1 TABLET BY MOUTH EVERY DAY 90 tablet 0  . CALCIUM CARBONATE/VITAMIN D3 (CALCIUM 600 + D,3, ORAL) Take 500 mg by mouth once daily      . docosahexanoic acid/epa (FISH OIL ORAL) Take 1,200 mg by mouth once daily    . donepeziL (ARICEPT) 10 MG tablet Take 1 tablet (10 mg total) by mouth nightly 90 tablet 3  . levothyroxine (SYNTHROID) 137 MCG tablet TAKE 137 MCG BY MOUTH ONCE DAILY TAKE ON AN EMPTY STOMACH WITH A GLASS OF WATER AT LEAST 30-60 MINUTES BEFORE BREAKFAST. 30 tablet 0  . multivitamin capsule Take 1 capsule by mouth once daily.    . pantoprazole (PROTONIX) 40 MG Dylan tablet Take 1 tablet (40 mg total) by mouth once daily 90 tablet 3  . saw palmetto 500 MG capsule Take 900 mg by mouth 2 (two) times daily      . vitamin E acetate (VITAMIN E ORAL) Take 1 tablet by mouth once daily     No current facility-administered medications for this visit.      ALLERGIES: Codeine  PAST MEDICAL HISTORY:     Past Medical History:  Diagnosis Date  . Arthritis   . GERD (gastroesophageal reflux disease)   . Hyperlipidemia   . Hypertension   . Peptic ulcer   . Post-surgical hypothyroidism   . Sleep apnea    on cpap  . Thyroid cancer (CMS-HCC) 2009    PAST SURGICAL HISTORY:  Past Surgical History:  Procedure Laterality Date  . COLONOSCOPY  01/14/2005   Dylan. Ivor Chen @ [place], FHPolyps(m), rpt 5 yrs per provider, 3 ltrs mailed  . COLONOSCOPY  12/19/2017   FHCP (Mother) CBF 12/2022  . HERNIA REPAIR Left 06/16/2017   Dylan Chen   . hiatus hernia repair  [phone]  . JOINT REPLACEMENT Left 2010   hip  . shoulder pain Right 2012   surgery [facility]  . THYROIDECTOMY TOTAL  2009   Surgeon: Dylan. Ayesha Chen, [facility]   . UPPER GASTROINTESTINAL ENDOSCOPY       FAMILY HISTORY:      Family History  Problem Relation Age of Onset  . Pancreatic cancer Mother   . Thyroid disease Mother 22       cancer  . Colon polyps Mother   . Lung cancer Father    . Diabetes type II Maternal Grandmother      SOCIAL HISTORY: Social History          Socioeconomic History  . Marital status: Married  Tobacco Use  . Smoking status: Former Smoker    Quit date: 02/13/1995    Years since quitting: 25.8  . Smokeless tobacco: Never Used  Vaping Use  . Vaping Use: Never used  Substance and Sexual Activity  . Alcohol use: No    Alcohol/week: 0.0 standard drinks  . Drug use: Not Currently  . Sexual activity: Not Currently      PHYSICAL EXAM:    Vitals:   12/23/20 1432  BP: 130/86  Pulse: 60   Body mass index is 30.04 kg/m. Weight: 79.4 kg (175 lb)   GENERAL: Alert, active, oriented x3  HEENT: Pupils equal reactive to light. Extraocular movements are intact. Sclera clear. Palpebral conjunctiva normal red color.Pharynx clear.  NECK: Supple with no palpable mass and no adenopathy.  LUNGS: Sound clear with no rales rhonchi or wheezes.  HEART: Regular rhythm S1 and S2 without murmur.  ABDOMEN: Soft and depressible, nontender with no palpable mass, no hepatomegaly.  Right inguinal hernia, reducible  EXTREMITIES: Well-developed well-nourished symmetrical with no dependent edema.  NEUROLOGICAL: Awake alert oriented, facial expression symmetrical, moving all extremities.  REVIEW OF DATA: I have reviewed the following data today:      Office Visit on 12/22/2020  Component Date Value  . Thyroid Stimulating Horm* 12/22/2020 0.724      ASSESSMENT: Dylan Chen is a 65 y.o. male presenting for consultation for right inguinal hernia.    The patient presents with a symptomatic, reducible inguinal hernia. Patient was oriented about the diagnosis of inguinal hernia and its implication. The patient was oriented about the treatment alternatives (observation vs surgical repair). Due to patient symptoms, repair is recommended. Patient oriented about the surgical procedure, the use of mesh and its risk of complications such  as: infection, bleeding, injury to vas deference, vasculature and testicle, injury to bowel or bladder, and chronic pain.  Patient understand the high risk of surgery due to previous open esophageal hernia repair.  Non-recurrent unilateral inguinal hernia without obstruction or gangrene [K40.90]  PLAN: 1. Robotic assisted laparoscopic right inguinal hernia repair with mesh (70017) 2.  CBC, CMP 3.  Avoid taking aspirin 5 days before procedure 4.  Contact us if has any question or concern.   Patient and his wife verbalized understanding, all questions were answered, and were agreeable with the plan outlined above.    Herbert Pun, MD  Electronically signed by Herbert Pun, MD

## 2020-12-23 NOTE — H&P (Signed)
PATIENT PROFILE: Dylan Chen is a 65 y.o. male who presents to the Clinic for consultation at the request of Dr. Kary Kos for evaluation of right inguinal hernia.  PCP:  Lovie Macadamia, MD  HISTORY OF PRESENT ILLNESS: Dylan Chen reports feeling a hernia in the right groin since 2 months ago.  Patient reports pain in the groin.  The pain radiates to the right scrotum.  Pain is aggravated by certain activities.  Alleviating factor is reducing the hernia.  The patient's aunt and have to lay back relax and massage the right groin to be able to reduce the hernia.  He denies abdominal incision nausea or vomiting.  Patient has had open left inguinal hernia repair 4 years ago.   PROBLEM LIST:         Problem List  Date Reviewed: 12/22/2020         Noted   Hyperlipidemia Unknown   Hypertension Unknown      GENERAL REVIEW OF SYSTEMS:   General ROS: negative for - chills, fatigue, fever, weight gain or weight loss Allergy and Immunology ROS: negative for - hives  Hematological and Lymphatic ROS: negative for - bleeding problems or bruising, negative for palpable nodes Endocrine ROS: negative for - heat or cold intolerance, hair changes Respiratory ROS: negative for - cough, shortness of breath or wheezing Cardiovascular ROS: no chest pain or palpitations GI ROS: negative for nausea, vomiting, abdominal pain, diarrhea, constipation Musculoskeletal ROS: negative for - joint swelling or muscle pain Neurological ROS: negative for - confusion, syncope Dermatological ROS: negative for pruritus and rash Psychiatric: negative for anxiety, depression, difficulty sleeping.positive for memory loss  MEDICATIONS: Current Medications        Current Outpatient Medications  Medication Sig Dispense Refill  . ascorbic acid (VITAMIN C) 500 MG tablet Take 1,000 mg by mouth once daily      . aspirin 81 MG EC tablet Take 81 mg by mouth once daily.    Marland Kitchen atorvastatin (LIPITOR) 20 MG tablet  TAKE 1 TABLET BY MOUTH EVERY DAY 90 tablet 0  . CALCIUM CARBONATE/VITAMIN D3 (CALCIUM 600 + D,3, ORAL) Take 500 mg by mouth once daily      . docosahexanoic acid/epa (FISH OIL ORAL) Take 1,200 mg by mouth once daily    . donepeziL (ARICEPT) 10 MG tablet Take 1 tablet (10 mg total) by mouth nightly 90 tablet 3  . levothyroxine (SYNTHROID) 137 MCG tablet TAKE 137 MCG BY MOUTH ONCE DAILY TAKE ON AN EMPTY STOMACH WITH A GLASS OF WATER AT LEAST 30-60 MINUTES BEFORE BREAKFAST. 30 tablet 0  . multivitamin capsule Take 1 capsule by mouth once daily.    . pantoprazole (PROTONIX) 40 MG DR tablet Take 1 tablet (40 mg total) by mouth once daily 90 tablet 3  . saw palmetto 500 MG capsule Take 900 mg by mouth 2 (two) times daily      . vitamin E acetate (VITAMIN E ORAL) Take 1 tablet by mouth once daily     No current facility-administered medications for this visit.      ALLERGIES: Codeine  PAST MEDICAL HISTORY:     Past Medical History:  Diagnosis Date  . Arthritis   . GERD (gastroesophageal reflux disease)   . Hyperlipidemia   . Hypertension   . Peptic ulcer   . Post-surgical hypothyroidism   . Sleep apnea    on cpap  . Thyroid cancer (CMS-HCC) 2009    PAST SURGICAL HISTORY:  Past Surgical History:  Procedure Laterality Date  . COLONOSCOPY  01/14/2005   Dr. Ivor Messier @ [place], FHPolyps(m), rpt 5 yrs per provider, 3 ltrs mailed  . COLONOSCOPY  12/19/2017   FHCP (Mother) CBF 12/2022  . HERNIA REPAIR Left 06/16/2017   Dr Rochel Brome   . hiatus hernia repair  [phone]  . JOINT REPLACEMENT Left 2010   hip  . shoulder pain Right 2012   surgery [facility]  . THYROIDECTOMY TOTAL  2009   Surgeon: [facility]. Ayesha Mohair, [facility]   . UPPER GASTROINTESTINAL ENDOSCOPY       FAMILY HISTORY:      Family History  Problem Relation Age of Onset  . Pancreatic cancer Mother   . Thyroid disease Mother 70       cancer  . Colon polyps Mother   . Lung cancer Father    . Diabetes type II Maternal Grandmother      SOCIAL HISTORY: Social History          Socioeconomic History  . Marital status: Married  Tobacco Use  . Smoking status: Former Smoker    Quit date: 02/13/1995    Years since quitting: 25.8  . Smokeless tobacco: Never Used  Vaping Use  . Vaping Use: Never used  Substance and Sexual Activity  . Alcohol use: No    Alcohol/week: 0.0 standard drinks  . Drug use: Not Currently  . Sexual activity: Not Currently      PHYSICAL EXAM:    Vitals:   12/23/20 1432  BP: 130/86  Pulse: 60   Body mass index is 30.04 kg/m. Weight: 79.4 kg (175 lb)   GENERAL: Alert, active, oriented x3  HEENT: Pupils equal reactive to light. Extraocular movements are intact. Sclera clear. Palpebral conjunctiva normal red color.Pharynx clear.  NECK: Supple with no palpable mass and no adenopathy.  LUNGS: Sound clear with no rales rhonchi or wheezes.  HEART: Regular rhythm S1 and S2 without murmur.  ABDOMEN: Soft and depressible, nontender with no palpable mass, no hepatomegaly.  Right inguinal hernia, reducible  EXTREMITIES: Well-developed well-nourished symmetrical with no dependent edema.  NEUROLOGICAL: Awake alert oriented, facial expression symmetrical, moving all extremities.  REVIEW OF DATA: I have reviewed the following data today:      Office Visit on 12/22/2020  Component Date Value  . Thyroid Stimulating Horm* 12/22/2020 0.724      ASSESSMENT: Dylan Chen is a 65 y.o. male presenting for consultation for right inguinal hernia.    The patient presents with a symptomatic, reducible inguinal hernia. Patient was oriented about the diagnosis of inguinal hernia and its implication. The patient was oriented about the treatment alternatives (observation vs surgical repair). Due to patient symptoms, repair is recommended. Patient oriented about the surgical procedure, the use of mesh and its risk of complications such  as: infection, bleeding, injury to vas deference, vasculature and testicle, injury to bowel or bladder, and chronic pain.  Patient understand the high risk of surgery due to previous open esophageal hernia repair.  Non-recurrent unilateral inguinal hernia without obstruction or gangrene [K40.90]  PLAN: 1. Robotic assisted laparoscopic right inguinal hernia repair with mesh (02774) 2.  CBC, CMP 3.  Avoid taking aspirin 5 days before procedure 4.  Contact us if has any question or concern.   Patient and his wife verbalized understanding, all questions were answered, and were agreeable with the plan outlined above.    Herbert Pun, MD  Electronically signed by Herbert Pun, MD

## 2020-12-25 ENCOUNTER — Ambulatory Visit: Payer: Self-pay | Admitting: General Surgery

## 2020-12-25 ENCOUNTER — Other Ambulatory Visit: Payer: Self-pay

## 2020-12-25 ENCOUNTER — Other Ambulatory Visit
Admission: RE | Admit: 2020-12-25 | Discharge: 2020-12-25 | Disposition: A | Discharge status: Home / Self Care | Payer: Medicare Other | Source: Ambulatory Visit | Attending: General Surgery | Admitting: General Surgery

## 2020-12-25 DIAGNOSIS — Z20822 Contact with and (suspected) exposure to covid-19: Secondary | ICD-10-CM | POA: Diagnosis not present

## 2020-12-25 DIAGNOSIS — Z01818 Encounter for other preprocedural examination: Secondary | ICD-10-CM | POA: Insufficient documentation

## 2020-12-25 DIAGNOSIS — Z01810 Encounter for preprocedural cardiovascular examination: Secondary | ICD-10-CM | POA: Diagnosis not present

## 2020-12-25 LAB — SARS CORONAVIRUS 2 (TAT 6-24 HRS): SARS Coronavirus 2: NEGATIVE

## 2020-12-25 NOTE — Patient Instructions (Signed)
Your procedure is scheduled on: Monday 12/29/20.   Report to THE FIRST FLOOR REGISTRATION DESK IN THE MEDICAL MALL ON THE MORNING OF SURGERY FIRST, THEN YOU WILL CHECK IN AT THE SURGERY INFORMATION DESK LOCATED OUTSIDE THE SAME DAY SURGERY DEPARTMENT LOCATED ON 2ND FLOOR MEDICAL MALL ENTRANCE.    To find out your arrival time please call [phone] between 1PM - 3PM on Friday 12/26/20.   Remember: Instructions that are not followed completely may result in serious medical risk, up to and including death, or upon the discretion of your surgeon and anesthesiologist your surgery may need to be rescheduled.     __X__ 1. Do not eat food after midnight the night before your procedure.                 No gum chewing or hard candies. You may drink clear liquids up to 2 hours                 before you are scheduled to arrive for your surgery- DO NOT drink clear                 liquids within 2 hours of the start of your surgery.                 Clear Liquids include:  water, apple juice without pulp, clear carbohydrate                 drink such as Clearfast or Gatorade, Black Coffee or Tea (Do not add                 milk or creamer to coffee or tea).  __X__2.  On the morning of surgery brush your teeth with toothpaste and water, you may rinse your mouth with mouthwash if you wish.  Do not swallow any toothpaste or mouthwash.    __X__ 3.  No Alcohol for 24 hours before or after surgery.  __X__ 4.  Do Not Smoke or use e-cigarettes For 24 Hours Prior to Your Surgery.                 Do not use any chewable tobacco products for at least 6 hours prior to                 surgery.  __X__5.  Notify your doctor if there is any change in your medical condition      (cold, fever, infections).      Do NOT wear jewelry, make-up, hairpins, clips or nail polish. Do NOT wear lotions, powders, or perfumes.  Do NOT shave 48 hours prior to surgery. Men may shave face and neck. Do NOT bring valuables to the  hospital.     [facility] is not responsible for any belongings or valuables.   Contacts, dentures/partials or body piercings may not be worn into surgery. Bring a case for your contacts, glasses or hearing aids, a denture cup will be supplied. Leave your suitcase in the car. After surgery it may be brought to your room.   For patients admitted to the hospital, discharge time is determined by your treatment team.    Patients discharged the day of surgery will not be allowed to drive home.     __X__ Take these medicines the morning of surgery with A SIP OF WATER:     1. atorvastatin (LIPITOR) if you normally take in the morning.  2. levothyroxine (SYNTHROID)  3. pantoprazole (PROTONIX)  __X__ Use CHG Soap/SAGE wipes as directed   __X__ Stop Blood Thinners: Aspirin. You reported that you already stopped this medication in preparation for surgery.  __X__ Stop Anti-inflammatories 7 days before surgery such as Advil, Ibuprofen, Motrin, BC or Goodies Powder, Naprosyn, Naproxen, Aleve, Aspirin, Meloxicam. May take Tylenol if needed for pain or discomfort.   __X__Do not start taking any new herbal supplements or vitamins prior to your procedure.  __X__ Stop the following herbal supplements or vitamins:   Ascorbic Acid (VITAMIN C)  Omega-3 Fatty Acids (FISH OIL)   Saw Palmetto  vitamin E   Wear comfortable clothing (specific to your surgery type) to the hospital.  Plan for stool softeners for home use; pain medications have a tendency to cause constipation. You can also help prevent constipation by eating foods high in fiber such as fruits and vegetables and drinking plenty of fluids as your diet allows.  After surgery, you can prevent lung complications by doing breathing exercises.Take deep breaths and cough every 1-2 hours. Your doctor may order a device called an Incentive Spirometer to help you take deep breaths.  Please call the [facility] at  [phone] if you have any questions about these instructions.

## 2020-12-29 ENCOUNTER — Other Ambulatory Visit: Payer: Self-pay

## 2020-12-29 ENCOUNTER — Encounter: Payer: Self-pay | Admitting: General Surgery

## 2020-12-29 ENCOUNTER — Ambulatory Visit
Admission: RE | Admit: 2020-12-29 | Discharge: 2020-12-29 | Disposition: A | Discharge status: Home / Self Care | Payer: Medicare Other | Attending: General Surgery | Admitting: General Surgery

## 2020-12-29 ENCOUNTER — Encounter
Admission: RE | Disposition: A | Discharge status: Home / Self Care | Payer: Self-pay | Source: Home / Self Care | Attending: General Surgery

## 2020-12-29 ENCOUNTER — Ambulatory Visit: Payer: Medicare Other | Admitting: Urgent Care

## 2020-12-29 DIAGNOSIS — Z96642 Presence of left artificial hip joint: Secondary | ICD-10-CM | POA: Diagnosis not present

## 2020-12-29 DIAGNOSIS — K4090 Unilateral inguinal hernia, without obstruction or gangrene, not specified as recurrent: Secondary | ICD-10-CM | POA: Diagnosis present

## 2020-12-29 DIAGNOSIS — Z79899 Other long term (current) drug therapy: Secondary | ICD-10-CM | POA: Diagnosis not present

## 2020-12-29 DIAGNOSIS — Z87891 Personal history of nicotine dependence: Secondary | ICD-10-CM | POA: Insufficient documentation

## 2020-12-29 DIAGNOSIS — Z85850 Personal history of malignant neoplasm of thyroid: Secondary | ICD-10-CM | POA: Insufficient documentation

## 2020-12-29 DIAGNOSIS — E890 Postprocedural hypothyroidism: Secondary | ICD-10-CM | POA: Diagnosis not present

## 2020-12-29 DIAGNOSIS — Z7982 Long term (current) use of aspirin: Secondary | ICD-10-CM | POA: Diagnosis not present

## 2020-12-29 DIAGNOSIS — Z79890 Hormone replacement therapy (postmenopausal): Secondary | ICD-10-CM | POA: Insufficient documentation

## 2020-12-29 DIAGNOSIS — Z885 Allergy status to narcotic agent status: Secondary | ICD-10-CM | POA: Insufficient documentation

## 2020-12-29 HISTORY — PX: XI ROBOTIC ASSISTED INGUINAL HERNIA REPAIR WITH MESH: SHX6706

## 2020-12-29 SURGERY — REPAIR, HERNIA, INGUINAL, ROBOT-ASSISTED, LAPAROSCOPIC, USING MESH
Anesthesia: General | Site: Groin | Laterality: Right

## 2020-12-29 MED ORDER — MIDAZOLAM HCL 2 MG/2ML IJ SOLN
INTRAMUSCULAR | Status: DC | PRN
  Administered 2020-12-29: 2 mg via INTRAVENOUS

## 2020-12-29 MED ORDER — ACETAMINOPHEN 10 MG/ML IV SOLN
INTRAVENOUS | Status: AC
  Filled 2020-12-29: qty 100

## 2020-12-29 MED ORDER — FENTANYL CITRATE (PF) 100 MCG/2ML IJ SOLN
INTRAMUSCULAR | Status: AC
  Filled 2020-12-29: qty 2

## 2020-12-29 MED ORDER — GLYCOPYRROLATE 0.2 MG/ML IJ SOLN
INTRAMUSCULAR | Status: DC | PRN
  Administered 2020-12-29: .2 mg via INTRAVENOUS

## 2020-12-29 MED ORDER — TRAMADOL HCL 50 MG PO TABS: 50.0000 mg | ORAL_TABLET | Freq: Four times a day (QID) | ORAL | 0 refills | Status: DC | PRN

## 2020-12-29 MED ORDER — SUGAMMADEX SODIUM 200 MG/2ML IV SOLN
INTRAVENOUS | Status: DC | PRN
  Administered 2020-12-29: 200 mg via INTRAVENOUS

## 2020-12-29 MED ORDER — CEFAZOLIN SODIUM-DEXTROSE 2-4 GM/100ML-% IV SOLN
2.0000 g | INTRAVENOUS | Status: AC
  Administered 2020-12-29: 2 g via INTRAVENOUS

## 2020-12-29 MED ORDER — FENTANYL CITRATE (PF) 100 MCG/2ML IJ SOLN
INTRAMUSCULAR | Status: DC | PRN
  Administered 2020-12-29: 50 ug via INTRAVENOUS

## 2020-12-29 MED ORDER — PHENYLEPHRINE HCL (PRESSORS) 10 MG/ML IV SOLN
INTRAVENOUS | Status: DC | PRN
  Administered 2020-12-29: 100 ug via INTRAVENOUS
  Administered 2020-12-29: 200 ug via INTRAVENOUS

## 2020-12-29 MED ORDER — OXYCODONE HCL 5 MG PO TABS
ORAL_TABLET | ORAL | Status: AC
  Administered 2020-12-29: 5 mg via ORAL
  Filled 2020-12-29: qty 1

## 2020-12-29 MED ORDER — OXYCODONE HCL 5 MG PO TABS
5.0000 mg | ORAL_TABLET | Freq: Once | ORAL | Status: AC | PRN
Start: 2020-12-29 — End: 2020-12-29

## 2020-12-29 MED ORDER — ONDANSETRON HCL 4 MG/2ML IJ SOLN
INTRAMUSCULAR | Status: DC | PRN
  Administered 2020-12-29: 4 mg via INTRAVENOUS

## 2020-12-29 MED ORDER — ORAL CARE MOUTH RINSE: 15.0000 mL | Freq: Once | OROMUCOSAL | Status: AC

## 2020-12-29 MED ORDER — CHLORHEXIDINE GLUCONATE 0.12 % MT SOLN
15.0000 mL | Freq: Once | OROMUCOSAL | Status: AC
  Administered 2020-12-29: 15 mL via OROMUCOSAL

## 2020-12-29 MED ORDER — ACETAMINOPHEN 10 MG/ML IV SOLN
INTRAVENOUS | Status: DC | PRN
  Administered 2020-12-29: 1000 mg via INTRAVENOUS

## 2020-12-29 MED ORDER — BUPIVACAINE-EPINEPHRINE 0.25% -1:200000 IJ SOLN
INTRAMUSCULAR | Status: DC | PRN
  Administered 2020-12-29: 30 mL

## 2020-12-29 MED ORDER — VASOPRESSIN 20 UNIT/ML IV SOLN
INTRAVENOUS | Status: DC | PRN
  Administered 2020-12-29 (×4): 2 [IU] via INTRAVENOUS

## 2020-12-29 MED ORDER — FENTANYL CITRATE (PF) 100 MCG/2ML IJ SOLN
25.0000 ug | INTRAMUSCULAR | Status: DC | PRN
  Administered 2020-12-29: 50 ug via INTRAVENOUS
  Administered 2020-12-29 (×2): 25 ug via INTRAVENOUS
  Administered 2020-12-29: 50 ug via INTRAVENOUS

## 2020-12-29 MED ORDER — PROPOFOL 10 MG/ML IV BOLUS
INTRAVENOUS | Status: AC
  Filled 2020-12-29: qty 20

## 2020-12-29 MED ORDER — PROPOFOL 10 MG/ML IV BOLUS
INTRAVENOUS | Status: DC | PRN
  Administered 2020-12-29: 150 mg via INTRAVENOUS
  Administered 2020-12-29: 50 mg via INTRAVENOUS

## 2020-12-29 MED ORDER — CEFAZOLIN SODIUM-DEXTROSE 2-4 GM/100ML-% IV SOLN
INTRAVENOUS | Status: AC
  Filled 2020-12-29: qty 100

## 2020-12-29 MED ORDER — LIDOCAINE HCL (CARDIAC) PF 100 MG/5ML IV SOSY
PREFILLED_SYRINGE | INTRAVENOUS | Status: DC | PRN
  Administered 2020-12-29: 100 mg via INTRAVENOUS

## 2020-12-29 MED ORDER — ROCURONIUM BROMIDE 100 MG/10ML IV SOLN
INTRAVENOUS | Status: DC | PRN
  Administered 2020-12-29: 50 mg via INTRAVENOUS

## 2020-12-29 MED ORDER — DEXAMETHASONE SODIUM PHOSPHATE 10 MG/ML IJ SOLN
INTRAMUSCULAR | Status: DC | PRN
  Administered 2020-12-29: 8 mg via INTRAVENOUS

## 2020-12-29 MED ORDER — DEXAMETHASONE SODIUM PHOSPHATE 10 MG/ML IJ SOLN
INTRAMUSCULAR | Status: AC
  Filled 2020-12-29: qty 1

## 2020-12-29 MED ORDER — ONDANSETRON HCL 4 MG/2ML IJ SOLN: 4.0000 mg | Freq: Once | INTRAMUSCULAR | Status: DC | PRN

## 2020-12-29 MED ORDER — BUPIVACAINE-EPINEPHRINE (PF) 0.25% -1:200000 IJ SOLN
INTRAMUSCULAR | Status: AC
  Filled 2020-12-29: qty 30

## 2020-12-29 MED ORDER — MIDAZOLAM HCL 2 MG/2ML IJ SOLN
INTRAMUSCULAR | Status: AC
  Filled 2020-12-29: qty 2

## 2020-12-29 MED ORDER — ROCURONIUM BROMIDE 10 MG/ML (PF) SYRINGE
PREFILLED_SYRINGE | INTRAVENOUS | Status: AC
  Filled 2020-12-29: qty 10

## 2020-12-29 MED ORDER — OXYCODONE HCL 5 MG/5ML PO SOLN: 5.0000 mg | Freq: Once | ORAL | Status: AC | PRN

## 2020-12-29 MED ORDER — FENTANYL CITRATE (PF) 100 MCG/2ML IJ SOLN: 50.0000 ug | Freq: Once | INTRAMUSCULAR | Status: DC

## 2020-12-29 MED ORDER — LACTATED RINGERS IV SOLN: INTRAVENOUS | Status: DC

## 2020-12-29 MED ORDER — CHLORHEXIDINE GLUCONATE 0.12 % MT SOLN
OROMUCOSAL | Status: AC
  Filled 2020-12-29: qty 15

## 2020-12-29 SURGICAL SUPPLY — 51 items
ADH SKN CLS APL DERMABOND .7 (GAUZE/BANDAGES/DRESSINGS) ×1
APL PRP STRL LF DISP 70% ISPRP (MISCELLANEOUS) ×1
BAG INFUSER PRESSURE 100CC (MISCELLANEOUS) IMPLANT
BLADE SURG SZ11 CARB STEEL (BLADE) ×2 IMPLANT
CANISTER SUCT 1200ML W/VALVE (MISCELLANEOUS) ×2 IMPLANT
CHLORAPREP W/TINT 26 (MISCELLANEOUS) ×2 IMPLANT
COVER TIP SHEARS 8 DVNC (MISCELLANEOUS) ×1 IMPLANT
COVER TIP SHEARS 8MM DA VINCI (MISCELLANEOUS) ×1
COVER WAND RF STERILE (DRAPES) ×4 IMPLANT
DEFOGGER SCOPE WARMER CLEARIFY (MISCELLANEOUS) ×2 IMPLANT
DERMABOND ADVANCED (GAUZE/BANDAGES/DRESSINGS) ×1
DERMABOND ADVANCED .7 DNX12 (GAUZE/BANDAGES/DRESSINGS) ×1 IMPLANT
DRAPE ARM DVNC X/XI (DISPOSABLE) ×3 IMPLANT
DRAPE COLUMN DVNC XI (DISPOSABLE) ×1 IMPLANT
DRAPE DA VINCI XI ARM (DISPOSABLE) ×3
DRAPE DA VINCI XI COLUMN (DISPOSABLE) ×1
ELECT REM PT RETURN 9FT ADLT (ELECTROSURGICAL) ×2
ELECTRODE REM PT RTRN 9FT ADLT (ELECTROSURGICAL) ×1 IMPLANT
GLOVE SURG ENC MOIS LTX SZ6.5 (GLOVE) ×4 IMPLANT
GLOVE SURG UNDER POLY LF SZ6.5 (GLOVE) ×4 IMPLANT
GOWN STRL REUS W/ TWL LRG LVL3 (GOWN DISPOSABLE) ×3 IMPLANT
GOWN STRL REUS W/TWL LRG LVL3 (GOWN DISPOSABLE) ×6
IRRIGATOR SUCT 8 DISP DVNC XI (IRRIGATION / IRRIGATOR) IMPLANT
IRRIGATOR SUCTION 8MM XI DISP (IRRIGATION / IRRIGATOR)
IV CATH ANGIO 12GX3 LT BLUE (NEEDLE) IMPLANT
IV NS 1000ML (IV SOLUTION)
IV NS 1000ML BAXH (IV SOLUTION) IMPLANT
KIT PINK PAD W/HEAD ARE REST (MISCELLANEOUS) ×2
KIT PINK PAD W/HEAD ARM REST (MISCELLANEOUS) ×1 IMPLANT
LABEL OR SOLS (LABEL) IMPLANT
MANIFOLD NEPTUNE II (INSTRUMENTS) ×2 IMPLANT
MESH 3DMAX 4X6 RT LRG (Mesh General) ×1 IMPLANT
MESH 3DMAX MID 4X6 RT LRG (Mesh General) ×1 IMPLANT
NEEDLE HYPO 22GX1.5 SAFETY (NEEDLE) ×2 IMPLANT
NEEDLE INSUFFLATION 14GA 120MM (NEEDLE) ×2 IMPLANT
OBTURATOR OPTICAL STANDARD 8MM (TROCAR) ×1
OBTURATOR OPTICAL STND 8 DVNC (TROCAR) ×1
OBTURATOR OPTICALSTD 8 DVNC (TROCAR) ×1 IMPLANT
PACK LAP CHOLECYSTECTOMY (MISCELLANEOUS) ×2 IMPLANT
SEAL CANN UNIV 5-8 DVNC XI (MISCELLANEOUS) ×3 IMPLANT
SEAL XI 5MM-8MM UNIVERSAL (MISCELLANEOUS) ×3
SET TUBE SMOKE EVAC HIGH FLOW (TUBING) ×2 IMPLANT
SOLUTION ELECTROLUBE (MISCELLANEOUS) ×2 IMPLANT
SUT MNCRL 4-0 (SUTURE) ×4
SUT MNCRL 4-0 27XMFL (SUTURE) ×2
SUT VIC AB 2-0 SH 27 (SUTURE) ×2
SUT VIC AB 2-0 SH 27XBRD (SUTURE) ×1 IMPLANT
SUT VLOC 90 S/L VL9 GS22 (SUTURE) ×2 IMPLANT
SUTURE MNCRL 4-0 27XMF (SUTURE) ×2 IMPLANT
TAPE TRANSPORE STRL 2 31045 (GAUZE/BANDAGES/DRESSINGS) IMPLANT
TRAY FOLEY MTR SLVR 16FR STAT (SET/KITS/TRAYS/PACK) ×2 IMPLANT

## 2020-12-29 NOTE — Anesthesia Preprocedure Evaluation (Addendum)
Anesthesia Evaluation  Patient identified by MRN, date of birth, ID band Patient awake    Reviewed: Allergy & Precautions, H&P , NPO status , Patient's Chart, lab work & pertinent test results  History of Anesthesia Complications Negative for: history of anesthetic complications  Airway Mallampati: II  TM Distance: >3 FB     Dental   Pulmonary sleep apnea , neg COPD, former smoker,    breath sounds clear to auscultation       Cardiovascular hypertension, (-) angina(-) Past MI and (-) Cardiac Stents (-) dysrhythmias  Rhythm:regular Rate:Normal     Neuro/Psych negative neurological ROS  negative psych ROS   GI/Hepatic Neg liver ROS, hiatal hernia, PUD, GERD  ,  Endo/Other  Hypothyroidism   Renal/GU      Musculoskeletal  (+) Arthritis ,   Abdominal   Peds  Hematology negative hematology ROS (+)   Anesthesia Other Findings Past Medical History: No date: Arthritis     Comment:  RIGHT HAND No date: Cancer ([place])     Comment:  THYROID No date: GERD (gastroesophageal reflux disease) No date: History of hiatal hernia No date: Hyperlipemia No date: Hypertension No date: Hypothyroidism No date: Peptic ulcer No date: Sleep apnea     Comment:  CPAP  Past Surgical History: 12/19/2017: COLONOSCOPY WITH PROPOFOL; N/A     Comment:  Procedure: COLONOSCOPY WITH PROPOFOL;  Surgeon: Manya Silvas, MD;  Location: [facility] ENDOSCOPY;  Service:               Endoscopy;  Laterality: N/A; No date: HIATAL HERNIA REPAIR 06/16/2017: INGUINAL HERNIA REPAIR; Left     Comment:  Procedure: HERNIA REPAIR INGUINAL ADULT;  Surgeon:               Leonie Green, MD;  Location: [facility] ORS;  Service:               General;  Laterality: Left; No date: JOINT REPLACEMENT; Left     Comment:  THR No date: ROTATOR CUFF REPAIR; Right No date: THYROIDECTOMY     Reproductive/Obstetrics negative OB ROS                             Anesthesia Physical Anesthesia Plan  ASA: II  Anesthesia Plan: General ETT   Post-op Pain Management:    Induction:   PONV Risk Score and Plan: Ondansetron, Dexamethasone and Treatment may vary due to age or medical condition  Airway Management Planned:   Additional Equipment:   Intra-op Plan:   Post-operative Plan:   Informed Consent: I have reviewed the patients History and Physical, chart, labs and discussed the procedure including the risks, benefits and alternatives for the proposed anesthesia with the patient or authorized representative who has indicated his/her understanding and acceptance.     Dental Advisory Given  Plan Discussed with: Anesthesiologist, CRNA and Surgeon  Anesthesia Plan Comments:        Anesthesia Quick Evaluation

## 2020-12-29 NOTE — Anesthesia Procedure Notes (Signed)
Procedure Name: Intubation Date/Time: 12/29/2020 4:09 PM Performed by: Allean Found, CRNA Pre-anesthesia Checklist: Patient identified, Patient being monitored, Timeout performed, Emergency Drugs available and Suction available Patient Re-evaluated:Patient Re-evaluated prior to induction Oxygen Delivery Method: Circle system utilized Preoxygenation: Pre-oxygenation with 100% oxygen Induction Type: IV induction Ventilation: Mask ventilation without difficulty Laryngoscope Size: McGraph and 4 Grade View: Grade I Tube type: Oral Tube size: 7.5 mm Number of attempts: 1 Airway Equipment and Method: Stylet and Video-laryngoscopy Placement Confirmation: ETT inserted through vocal cords under direct vision,  positive ETCO2 and breath sounds checked- equal and bilateral Secured at: 21 cm Tube secured with: Tape Dental Injury: Teeth and Oropharynx as per pre-operative assessment

## 2020-12-29 NOTE — Progress Notes (Signed)
Pt ambulated around unit multiple times, no pain at present

## 2020-12-29 NOTE — Interval H&P Note (Signed)
History and Physical Interval Note:  12/29/2020 3:27 PM  Dylan Chen  has presented today for surgery, with the diagnosis of Non-recurrent unilateral inguinal hernia without obstruction or gangrene K40.90.  The various methods of treatment have been discussed with the patient and family. After consideration of risks, benefits and other options for treatment, the patient has consented to  Procedure(s): XI ROBOTIC [place] (Right) as a surgical intervention.  The patient's history has been reviewed, patient examined, no change in status, stable for surgery.  I have reviewed the patient's chart and labs.  Questions were answered to the patient's satisfaction.     Herbert Pun

## 2020-12-29 NOTE — Op Note (Signed)
Preoperative diagnosis: Right inguinal hernia.   Postoperative diagnosis: Right direct and femoral inguinal hernia.  Procedure: Robotic assisted Laparoscopic Transabdominal preperitoneal laparoscopic (TAPP) repair of right inguinal hernia.  Anesthesia: GETA  Surgeon: Dr. Windell Moment  Wound Classification: Clean  Indications:  Patient is a 65 y.o. male developed a symptomatic right inguinal hernia. Repair was indicated.  Findings: 1.  Right direct and femoral inguinal hernia identified   2. Vas deferens and cord structures identified and preserved 3. Bard 3D Max mesh used for repair   4. Adequate hemostasis.   Description of procedure: The patient was taken to the operating room and the correct side of surgery was verified. The patient was placed supine with arms tucked at the sides. After obtaining adequate anesthesia, the patient's abdomen was prepped and draped in standard sterile fashion. The patient was placed in the Trendelenburg position. A time-out was completed verifying correct patient, procedure, site, positioning, and implant(s) and/or special equipment prior to beginning this procedure. A Veress needle was placed at the umbilicus and pneumoperitoneum created with insufflation of carbon dioxide to 15 mmHg. After the Veress needle was removed, an 8-mm trocar was placed on epigastric area and the 30 angled laparoscope inserted. Two 8-mm trocars were then placed lateral to the rectus sheath under direct visualization. Both inguinal regions were inspected and the median umbilical ligament, medial umbilical ligament, and lateral umbilical fold were identified.  The robotic arms were docked. The robotic scope was inserted and the pelvic area anatomy targeted.  The peritoneum was incised with scissors along a line 5 cm above the superior edge of the hernia defect, extending from the median umbilical ligament to the anterior superior iliac spine. The peritoneal flap was mobilized  inferiorly using blunt and sharp dissection. The inferior epigastric vessels were exposed and the pubic symphysis was identified. Cooper's ligament was dissected to its junction with the iliac vein. The dissection was continued inferiorly to the iliopubic tract, with care taken to avoid injury to the femoral branch of the genitofemoral nerve and the lateral femoral cutaneous nerve. The cord structures were parietalized. The hernia was identified and reduced by gentle traction.  The right direct hernia sac was noted mobilized from the cord structures and reduced into the peritoneal cavity.  A large piece of mesh was rolled longitudinally into a compact cylinder and passed through a trocar. The cylinder was placed along the inferior aspect of the working space and unrolled into place to completely cover the direct, indirect, and femoral spaces. The mesh was secured into place superiorly to the anterior abdominal wall and inferiorly and medially to Cooper's ligament with absorbable sutures. Care was taken to avoid the inferolateral triangles containing the iliac vessels and genital nerves. The peritoneal flap was closed over the mesh and secured with suture in similar positions of safety. After ensuring adequate hemostasis, the trocars were removed and the pneumoperitoneum allowed to escape. The trocar incisions were closed using monocryl and skin adhesive dressings applied.  The patient tolerated the procedure well and was taken to the postanesthesia care unit in stable condition.   Specimen: None  Complications: None  Estimated Blood Loss: 5 mL

## 2020-12-29 NOTE — Discharge Instructions (Signed)
AMBULATORY SURGERY  DISCHARGE INSTRUCTIONS   1) The drugs that you were given will stay in your system until tomorrow so for the next 24 hours you should not:  A) Drive an automobile B) Make any legal decisions C) Drink any alcoholic beverage   2) You may resume regular meals tomorrow.  Today it is better to start with liquids and gradually work up to solid foods.  You may eat anything you prefer, but it is better to start with liquids, then soup and crackers, and gradually work up to solid foods.   3) Please notify your doctor immediately if you have any unusual bleeding, trouble breathing, redness and pain at the surgery site, drainage, fever, or pain not relieved by medication.    4) Additional Instructions:        Please contact your physician with any problems or Same Day Surgery at [phone], Monday through Friday 6 am to 4 pm, or [place] [facility] [place] number at [phone]. Diet: Resume home heart healthy regular diet.   Activity: No heavy lifting >20 pounds (children, pets, laundry, garbage) or strenuous activity until follow-up, but light activity and walking are encouraged. Do not drive or drink alcohol if taking narcotic pain medications.  Wound care: May shower with soapy water and pat dry (do not rub incisions), but no baths or submerging incision underwater until follow-up. (no swimming)   Medications: Resume all home medications. For mild to moderate pain: acetaminophen (Tylenol) or ibuprofen (if no kidney disease). Combining Tylenol with alcohol can substantially increase your risk of causing liver disease. Narcotic pain medications, if prescribed, can be used for severe pain, though may cause nausea, constipation, and drowsiness. Do not combine Tylenol and Norco within a 6 hour period as Norco contains Tylenol. If you do not need the narcotic pain medication, you do not need to fill the prescription.  Call office ([phone]) at any time if any  questions, worsening pain, fevers/chills, bleeding, drainage from incision site, or other concerns.  

## 2020-12-29 NOTE — Anesthesia Postprocedure Evaluation (Signed)
Anesthesia Post Note  Patient: Dylan Chen  Procedure(s) Performed: XI ROBOTIC ASSISTED INGUINAL HERNIA REPAIR WITH MESH (Right Groin)  Patient location during evaluation: PACU Anesthesia Type: General Level of consciousness: awake and alert Pain management: pain level controlled Vital Signs Assessment: post-procedure vital signs reviewed and stable Respiratory status: spontaneous breathing and respiratory function stable Cardiovascular status: stable Anesthetic complications: no   No complications documented.   Last Vitals:  Vitals:   12/29/20 1805 12/29/20 1815  BP:  96/65  Pulse: 64 (!) 53  Resp: 17 17  Temp:    SpO2: 100% 98%    Last Pain:  Vitals:   12/29/20 1819  PainSc: 10-Worst pain ever                 Emery Dupuy K

## 2020-12-29 NOTE — Transfer of Care (Signed)
Immediate Anesthesia Transfer of Care Note  Patient: Dylan Chen  Procedure(s) Performed: XI ROBOTIC ASSISTED INGUINAL HERNIA REPAIR WITH MESH (Right Groin)  Patient Location: PACU  Anesthesia Type:General  Level of Consciousness: drowsy  Airway & Oxygen Therapy: Patient Spontanous Breathing and Patient connected to face mask oxygen  Post-op Assessment: Report given to RN and Post -op Vital signs reviewed and stable  Post vital signs: Reviewed and stable  Last Vitals:  Vitals Value Taken Time  BP    Temp    Pulse    Resp    SpO2      Last Pain:  Vitals:   12/29/20 1314  PainSc: 0-No pain      Patients Stated Pain Goal: 0 ([id number])  Complications: No complications documented.

## 2020-12-30 ENCOUNTER — Encounter: Payer: Self-pay | Admitting: General Surgery

## 2021-01-28 ENCOUNTER — Other Ambulatory Visit: Payer: Self-pay

## 2021-01-28 ENCOUNTER — Encounter: Payer: Self-pay | Admitting: Emergency Medicine

## 2021-01-28 ENCOUNTER — Emergency Department: Payer: Medicare Other

## 2021-01-28 ENCOUNTER — Emergency Department
Admission: EM | Admit: 2021-01-28 | Discharge: 2021-01-28 | Disposition: A | Discharge status: Home / Self Care | Payer: Medicare Other | Attending: Emergency Medicine | Admitting: Emergency Medicine

## 2021-01-28 DIAGNOSIS — Z87891 Personal history of nicotine dependence: Secondary | ICD-10-CM | POA: Insufficient documentation

## 2021-01-28 DIAGNOSIS — W19XXXA Unspecified fall, initial encounter: Secondary | ICD-10-CM | POA: Diagnosis not present

## 2021-01-28 DIAGNOSIS — I10 Essential (primary) hypertension: Secondary | ICD-10-CM | POA: Diagnosis not present

## 2021-01-28 DIAGNOSIS — M25551 Pain in right hip: Secondary | ICD-10-CM | POA: Diagnosis not present

## 2021-01-28 DIAGNOSIS — F0390 Unspecified dementia without behavioral disturbance: Secondary | ICD-10-CM | POA: Insufficient documentation

## 2021-01-28 DIAGNOSIS — Z7982 Long term (current) use of aspirin: Secondary | ICD-10-CM | POA: Insufficient documentation

## 2021-01-28 DIAGNOSIS — Z85850 Personal history of malignant neoplasm of thyroid: Secondary | ICD-10-CM | POA: Insufficient documentation

## 2021-01-28 HISTORY — DX: Unspecified dementia, unspecified severity, without behavioral disturbance, psychotic disturbance, mood disturbance, and anxiety: F03.90

## 2021-01-28 MED ORDER — TRAMADOL HCL 50 MG PO TABS
50.0000 mg | ORAL_TABLET | Freq: Once | ORAL | Status: AC
  Administered 2021-01-28: 50 mg via ORAL
  Filled 2021-01-28: qty 1

## 2021-01-28 MED ORDER — OXYCODONE-ACETAMINOPHEN 2.5-325 MG PO TABS: 1.0000 | ORAL_TABLET | ORAL | 0 refills | Status: DC | PRN

## 2021-01-28 NOTE — ED Triage Notes (Signed)
Pt comes into the ED via POV c/o falls 3 weeks ago.  Pt was seen by his PCP where they were supposed to set him up with PT.  Pt still has not gotten a call from PT.  The PCP did not complete any imaging.  PT still having right side hip pain and down through the right leg.  Per the pt's wife, all falls 3 weeks ago were mechanical falls.  Pt in NAD at this time with even and unlabored respirations.

## 2021-01-28 NOTE — ED Provider Notes (Signed)
[facility] Emergency Department Provider Note   ____________________________________________   Event Date/Time   First MD Initiated Contact with Patient 01/28/21 1305     (approximate)  I have reviewed the triage vital signs and the nursing notes.   HISTORY  Chief Complaint Fall    HPI Dylan Chen is a 65 y.o. male with the below stated past medical history presents for right-sided hip pain after multiple falls 3 weeks prior to arrival.  Patient states that he has seen his primary care physician who stated that patient was going to be set up with physical therapy at home however patient has still not been able to get to physical therapy due to "insurance complications".  Patient states that he has been unable to walk on this leg since the incident however does admit to walking out to his wife this morning using his walker however it was "very slow".  Patient describes 10/10, aching, shooting pain down his right leg that is worse with palpation over the lateral right hip as well as with ambulation.  Patient denies any relieving factors.  Patient has not tried any medications for the symptoms.  Patient currently denies any vision changes, tinnitus, difficulty speaking, facial droop, sore throat, chest pain, shortness of breath, abdominal pain, nausea/vomiting/diarrhea, dysuria, or weakness/numbness/paresthesias in any extremity         Past Medical History:  Diagnosis Date  . Arthritis    RIGHT HAND  . Cancer (HCC)    THYROID  . Dementia ([place])   . GERD (gastroesophageal reflux disease)   . History of hiatal hernia   . Hyperlipemia   . Hypertension   . Hypothyroidism   . Peptic ulcer   . Sleep apnea    CPAP    There are no problems to display for this patient.   Past Surgical History:  Procedure Laterality Date  . COLONOSCOPY WITH PROPOFOL N/A 12/19/2017   Procedure: COLONOSCOPY WITH PROPOFOL;  Surgeon: Manya Silvas, MD;  Location: [facility]  ENDOSCOPY;  Service: Endoscopy;  Laterality: N/A;  . HIATAL HERNIA REPAIR    . INGUINAL HERNIA REPAIR Left 06/16/2017   Procedure: HERNIA REPAIR INGUINAL ADULT;  Surgeon: Leonie Green, MD;  Location: [facility] ORS;  Service: General;  Laterality: Left;  . JOINT REPLACEMENT Left    THR  . ROTATOR CUFF REPAIR Right   . THYROIDECTOMY    . XI ROBOTIC ASSISTED INGUINAL HERNIA REPAIR WITH MESH Right 12/29/2020   Procedure: XI ROBOTIC ASSISTED INGUINAL HERNIA REPAIR WITH MESH;  Surgeon: Herbert Pun, MD;  Location: [facility] ORS;  Service: General;  Laterality: Right;    Prior to Admission medications   Medication Sig Start Date End Date Taking? Authorizing Provider  oxycodone-acetaminophen (PERCOCET) 2.5-325 MG tablet Take 1 tablet by mouth every 4 (four) hours as needed for pain. 01/28/21  Yes Naaman Plummer, MD  Ascorbic Acid (VITAMIN C) 1000 MG tablet Take 1,000 mg by mouth daily.     [provider]  aspirin EC 81 MG tablet Take 81 mg by mouth at bedtime.    [provider]  atorvastatin (LIPITOR) 20 MG tablet Take 20 mg by mouth daily.    [provider]  Calcium Carb-Cholecalciferol (CALCIUM 600 + D PO) Take 2 tablets by mouth every evening.     [provider]  cholecalciferol (VITAMIN D3) 25 MCG (1000 UNIT) tablet Take 1,000 Units by mouth at bedtime.    [provider]  donepezil (ARICEPT) 10  MG tablet Take 10 mg by mouth at bedtime.    [provider]  levothyroxine (SYNTHROID) 137 MCG tablet Take 137 mcg by mouth daily before breakfast. 04/30/17   [provider]  meloxicam (MOBIC) 15 MG tablet Take 15 mg by mouth daily as needed for pain.    [provider]  Multiple Vitamin (MULTIVITAMIN WITH MINERALS) TABS tablet Take 1 tablet by mouth every evening.    [provider]  Omega-3 Fatty Acids (FISH OIL) 1000 MG CPDR Take 1,000 mg by mouth at bedtime.    [provider]  pantoprazole (PROTONIX)  40 MG tablet Take 40 mg by mouth daily.    [provider]  polyvinyl alcohol (LIQUIFILM TEARS) 1.4 % ophthalmic solution Place 1 drop into both eyes every 4 (four) hours as needed for dry eyes.    [provider]  Saw Palmetto, Serenoa repens, 450 MG CAPS Take 900 mg by mouth in the morning and at bedtime.    [provider]  traMADol (ULTRAM) 50 MG tablet Take 1 tablet (50 mg total) by mouth every 6 (six) hours as needed. 12/29/20 12/29/21  Herbert Pun, MD  vitamin B-12 (CYANOCOBALAMIN) 1000 MCG tablet Take 1,000 mcg by mouth daily.    [provider]  vitamin E 180 MG (400 UNITS) capsule Take 400 Units by mouth daily.    [provider]    Allergies Codeine  History reviewed. No pertinent family history.  Social History Social History   Tobacco Use  . Smoking status: Former Smoker    Packs/day: 2.00    Years: 40.00    Pack years: 80.00    Types: Cigarettes, Cigars    Quit date: 06/10/1997    Years since quitting: 23.6  . Smokeless tobacco: Former Network engineer  . Vaping Use: Never used  Substance Use Topics  . Alcohol use: No  . Drug use: No    Review of Systems Constitutional: No fever/chills Eyes: No visual changes. ENT: No sore throat. Cardiovascular: Denies chest pain. Respiratory: Denies shortness of breath. Gastrointestinal: No abdominal pain.  No nausea, no vomiting.  No diarrhea. Genitourinary: Negative for dysuria. Musculoskeletal: Positive for acute right hip pain Skin: Negative for rash. Neurological: Negative for headaches, weakness/numbness/paresthesias in any extremity Psychiatric: Negative for suicidal ideation/homicidal ideation   ____________________________________________   PHYSICAL EXAM:  VITAL SIGNS: ED Triage Vitals [01/28/21 1207]  Enc Vitals Group     BP 134/82     Pulse Rate 80     Resp 18     Temp 99.7 F (37.6 C)     Temp Source Oral     SpO2 96 %     Weight 175 lb (79.4  kg)     Height 5\' 5"  (1.651 m)     Head Circumference      Peak Flow      Pain Score 6     Pain Loc      Pain Edu?      Excl. in [place]?    Constitutional: Alert and oriented. Well appearing and in no acute distress. Eyes: Conjunctivae are normal. PERRL. Head: Atraumatic. Nose: No congestion/rhinnorhea. Mouth/Throat: Mucous membranes are moist. Neck: No stridor Cardiovascular: Grossly normal heart sounds.  Good peripheral circulation. Respiratory: Normal respiratory effort.  No retractions. Gastrointestinal: Soft and nontender. No distention. Musculoskeletal: No obvious deformities.  Tenderness to palpation over the right lateral hip Neurologic:  Normal speech and language. No gross focal neurologic deficits are appreciated. Skin:  Skin is warm and dry. No rash noted. Psychiatric: Mood and affect are normal. Speech and behavior are normal.  ____________________________________________   LABS (all labs ordered are listed, but only abnormal results are displayed)  Labs Reviewed - No data to display  RADIOLOGY  ED MD interpretation: X-ray of the right hip and knee show no evidence of acute abnormalities  Official radiology report(s): No results found.  ____________________________________________   PROCEDURES  Procedure(s) performed (including Critical Care):  Procedures   ____________________________________________   INITIAL IMPRESSION / ASSESSMENT AND PLAN / ED COURSE  As part of my medical decision making, I reviewed the following data within the [facility] notes reviewed and incorporated, Labs reviewed, EKG interpreted, Old chart reviewed, Radiograph reviewed and Notes from prior ED visits reviewed and incorporated        65 year old male presents for right hip pain Given history, exam and workup I have low suspicion for fracture, dislocation, significant ligamentous injury, septic arthritis, gout flare, new autoimmune arthropathy, or  gonococcal arthropathy.  Interventions: CT and x-ray of the right hip showed no evidence of acute bony abnormalities Disposition: Discharge home with strict return precautions and instructions for prompt primary care follow up in the next week.      ____________________________________________   FINAL CLINICAL IMPRESSION(S) / ED DIAGNOSES  Final diagnoses:  Right hip pain     ED Discharge Orders         Ordered    oxycodone-acetaminophen (PERCOCET) 2.5-325 MG tablet  Every 4 hours PRN        01/28/21 1446           Note:  This document was prepared using Dragon voice recognition software and may include unintentional dictation errors.   Naaman Plummer, MD 01/29/21 [phone]

## 2021-01-28 NOTE — ED Notes (Signed)
Discharge instructions reviewed with pt and wife . Pt calm , collective .

## 2021-05-12 ENCOUNTER — Other Ambulatory Visit: Payer: Self-pay | Admitting: Physician Assistant

## 2021-05-12 DIAGNOSIS — M12812 Other specific arthropathies, not elsewhere classified, left shoulder: Secondary | ICD-10-CM

## 2021-05-12 DIAGNOSIS — M25512 Pain in left shoulder: Secondary | ICD-10-CM

## 2021-05-21 ENCOUNTER — Other Ambulatory Visit: Payer: Self-pay

## 2021-05-21 ENCOUNTER — Ambulatory Visit
Admission: RE | Admit: 2021-05-21 | Discharge: 2021-05-21 | Disposition: A | Discharge status: Home / Self Care | Payer: Medicare Other | Source: Ambulatory Visit | Attending: Physician Assistant | Admitting: Physician Assistant

## 2021-05-21 DIAGNOSIS — M12812 Other specific arthropathies, not elsewhere classified, left shoulder: Secondary | ICD-10-CM | POA: Diagnosis not present

## 2021-05-21 DIAGNOSIS — M25512 Pain in left shoulder: Secondary | ICD-10-CM | POA: Insufficient documentation

## 2021-06-12 DIAGNOSIS — M7582 Other shoulder lesions, left shoulder: Secondary | ICD-10-CM | POA: Insufficient documentation

## 2021-06-12 DIAGNOSIS — S46012A Strain of muscle(s) and tendon(s) of the rotator cuff of left shoulder, initial encounter: Secondary | ICD-10-CM | POA: Insufficient documentation

## 2021-06-22 ENCOUNTER — Other Ambulatory Visit: Payer: Self-pay

## 2021-06-22 ENCOUNTER — Other Ambulatory Visit: Payer: Self-pay | Admitting: Surgery

## 2021-06-22 ENCOUNTER — Encounter
Admission: RE | Admit: 2021-06-22 | Discharge: 2021-06-22 | Disposition: A | Discharge status: Home / Self Care | Payer: Medicare Other | Source: Ambulatory Visit | Attending: Surgery | Admitting: Surgery

## 2021-06-22 NOTE — Patient Instructions (Addendum)
Your procedure is scheduled on: Thursday 06/25/21 Report to the Registration Desk on the 1st floor of the [facility]. To find out your arrival time, please call [phone] between 1PM - 3PM on: Wednesday 06/24/21  REMEMBER: Instructions that are not followed completely may result in serious medical risk, up to and including death; or upon the discretion of your surgeon and anesthesiologist your surgery may need to be rescheduled.  Do not eat food after midnight the night before surgery.  No gum chewing, lozengers or hard candies.  You may however, drink CLEAR liquids up to 2 hours before you are scheduled to arrive for your surgery. Do not drink anything within 2 hours of your scheduled arrival time.  Clear liquids include: - water  - apple juice without pulp - gatorade (not RED, PURPLE, OR BLUE) - black coffee or tea (Do NOT add milk or creamers to the coffee or tea) Do NOT drink anything that is not on this list.  In addition, your doctor has ordered for you to drink the provided  Ensure Pre-Surgery Clear Carbohydrate Drink  Gatorade G2 Drinking this carbohydrate drink up to two hours before surgery helps to reduce insulin resistance and improve patient outcomes. Please complete drinking 2 hours prior to scheduled arrival time.  TAKE THESE MEDICATIONS THE MORNING OF SURGERY WITH A SIP OF WATER: levothyroxine (SYNTHROID) 137 MCG tablet pantoprazole (PROTONIX) 40 MG tablet (take one the night before and one on the morning of surgery - helps to prevent nausea after surgery.)  Can take Vitamin B12 on Wednesday.  One week prior to surgery: Stop Anti-inflammatories (NSAIDS) such as Advil, Aleve, Ibuprofen, Motrin, Naproxen, Naprosyn and Aspirin based products such as Excedrin, Goodys Powder, BC Powder. Stop ANY OVER THE COUNTER supplements until after surgery. You may however, continue to take Tylenol if needed for pain up until the day of surgery.  No Alcohol for 24 hours before  or after surgery.  No Smoking including e-cigarettes for 24 hours prior to surgery.  No chewable tobacco products for at least 6 hours prior to surgery.  No nicotine patches on the day of surgery.  Do not use any "recreational" drugs for at least a week prior to your surgery.  Please be advised that the combination of cocaine and anesthesia may have negative outcomes, up to and including death. If you test positive for cocaine, your surgery will be cancelled.  On the morning of surgery brush your teeth with toothpaste and water, you may rinse your mouth with mouthwash if you wish. Do not swallow any toothpaste or mouthwash.  Use CHG Soap or wipes as directed on instruction sheet.  Do not wear jewelry  Do not wear lotions, powders, or cologne.   Do not shave body from the neck down 48 hours prior to surgery just in case you cut yourself which could leave a site for infection.  Also, freshly shaved skin may become irritated if using the CHG soap.  Do not bring valuables to the hospital. [facility] is not responsible for any missing/lost belongings or valuables.   Notify your doctor if there is any change in your medical condition (cold, fever, infection).  Wear comfortable clothing (specific to your surgery type) to the hospital.  After surgery, you can help prevent lung complications by doing breathing exercises.  Take deep breaths and cough every 1-2 hours. Your doctor may order a device called an Incentive Spirometer to help you take deep breaths.  If you are being admitted  to the hospital overnight, leave your suitcase in the car. After surgery it may be brought to your room.  If you are being discharged the day of surgery, you will not be allowed to drive home. You will need a responsible adult (18 years or older) to drive you home and stay with you that night.   If you are taking public transportation, you will need to have a responsible adult (18 years or older) with  you. Please confirm with your physician that it is acceptable to use public transportation.   Please call the [facility] at [phone] if you have any questions about these instructions.  Surgery Visitation Policy:  Patients undergoing a surgery or procedure may have one family member or support person with them as long as that person is not COVID-19 positive or experiencing its symptoms.  That person may remain in the waiting area during the procedure and may rotate out with other people.  Inpatient Visitation:    Visiting hours are 7 a.m. to 8 p.m. Up to two visitors ages 16+ are allowed at one time in a patient room. The visitors may rotate out with other people during the day. Visitors must check out when they leave, or other visitors will not be allowed. One designated support person may remain overnight. The visitor must pass COVID-19 screenings, use hand sanitizer when entering and exiting the patient's room and wear a mask at all times, including in the patient's room. Patients must also wear a mask when staff or their visitor are in the room. Masking is required regardless of vaccination status.

## 2021-06-24 MED ORDER — LACTATED RINGERS IV SOLN: INTRAVENOUS | Status: DC

## 2021-06-24 MED ORDER — CEFAZOLIN SODIUM-DEXTROSE 2-4 GM/100ML-% IV SOLN
2.0000 g | INTRAVENOUS | Status: AC
  Administered 2021-06-25: 2 g via INTRAVENOUS

## 2021-06-24 MED ORDER — ORAL CARE MOUTH RINSE: 15.0000 mL | Freq: Once | OROMUCOSAL | Status: AC

## 2021-06-24 MED ORDER — CHLORHEXIDINE GLUCONATE 0.12 % MT SOLN
15.0000 mL | Freq: Once | OROMUCOSAL | Status: AC
  Administered 2021-06-25: 15 mL via OROMUCOSAL

## 2021-06-24 MED ORDER — FAMOTIDINE 20 MG PO TABS: 20.0000 mg | ORAL_TABLET | Freq: Once | ORAL | Status: DC

## 2021-06-25 ENCOUNTER — Ambulatory Visit: Payer: Medicare Other | Admitting: Registered Nurse

## 2021-06-25 ENCOUNTER — Encounter: Payer: Self-pay | Admitting: Surgery

## 2021-06-25 ENCOUNTER — Ambulatory Visit: Payer: Medicare Other

## 2021-06-25 ENCOUNTER — Ambulatory Visit
Admission: RE | Admit: 2021-06-25 | Discharge: 2021-06-25 | Disposition: A | Discharge status: Home / Self Care | Payer: Medicare Other | Attending: Surgery | Admitting: Surgery

## 2021-06-25 ENCOUNTER — Encounter
Admission: RE | Disposition: A | Discharge status: Home / Self Care | Payer: Self-pay | Source: Home / Self Care | Attending: Surgery

## 2021-06-25 DIAGNOSIS — Z87891 Personal history of nicotine dependence: Secondary | ICD-10-CM | POA: Diagnosis not present

## 2021-06-25 DIAGNOSIS — S46012A Strain of muscle(s) and tendon(s) of the rotator cuff of left shoulder, initial encounter: Secondary | ICD-10-CM | POA: Diagnosis not present

## 2021-06-25 DIAGNOSIS — Z419 Encounter for procedure for purposes other than remedying health state, unspecified: Secondary | ICD-10-CM

## 2021-06-25 DIAGNOSIS — W11XXXA Fall on and from ladder, initial encounter: Secondary | ICD-10-CM | POA: Diagnosis not present

## 2021-06-25 HISTORY — PX: SHOULDER ARTHROSCOPY WITH SUBACROMIAL DECOMPRESSION, ROTATOR CUFF REPAIR AND BICEP TENDON REPAIR: SHX5687

## 2021-06-25 SURGERY — SHOULDER ARTHROSCOPY WITH SUBACROMIAL DECOMPRESSION, ROTATOR CUFF REPAIR AND BICEP TENDON REPAIR
Anesthesia: General | Site: Shoulder | Laterality: Left

## 2021-06-25 MED ORDER — BUPIVACAINE LIPOSOME 1.3 % IJ SUSP
INTRAMUSCULAR | Status: AC
  Filled 2021-06-25: qty 10

## 2021-06-25 MED ORDER — DEXAMETHASONE SODIUM PHOSPHATE 10 MG/ML IJ SOLN
INTRAMUSCULAR | Status: AC
  Filled 2021-06-25: qty 1

## 2021-06-25 MED ORDER — OXYCODONE HCL 5 MG PO TABS: 5.0000 mg | ORAL_TABLET | ORAL | 0 refills | Status: DC | PRN

## 2021-06-25 MED ORDER — MIDAZOLAM HCL 2 MG/2ML IJ SOLN
INTRAMUSCULAR | Status: AC
  Filled 2021-06-25: qty 2

## 2021-06-25 MED ORDER — ONDANSETRON HCL 4 MG/2ML IJ SOLN: 4.0000 mg | Freq: Four times a day (QID) | INTRAMUSCULAR | Status: DC | PRN

## 2021-06-25 MED ORDER — ONDANSETRON HCL 4 MG/2ML IJ SOLN
INTRAMUSCULAR | Status: AC
  Filled 2021-06-25: qty 2

## 2021-06-25 MED ORDER — ONDANSETRON HCL 4 MG/2ML IJ SOLN
INTRAMUSCULAR | Status: DC | PRN
  Administered 2021-06-25: 4 mg via INTRAVENOUS

## 2021-06-25 MED ORDER — PROPOFOL 10 MG/ML IV BOLUS
INTRAVENOUS | Status: AC
  Filled 2021-06-25: qty 20

## 2021-06-25 MED ORDER — KETOROLAC TROMETHAMINE 15 MG/ML IJ SOLN: 15.0000 mg | Freq: Once | INTRAMUSCULAR | Status: DC

## 2021-06-25 MED ORDER — FENTANYL CITRATE (PF) 100 MCG/2ML IJ SOLN
INTRAMUSCULAR | Status: DC | PRN
  Administered 2021-06-25: 50 ug via INTRAVENOUS

## 2021-06-25 MED ORDER — MIDAZOLAM HCL 2 MG/2ML IJ SOLN
1.0000 mg | INTRAMUSCULAR | Status: AC | PRN
Start: 2021-06-25 — End: 2021-06-25
  Administered 2021-06-25: 1 mg via INTRAVENOUS

## 2021-06-25 MED ORDER — METOCLOPRAMIDE HCL 5 MG/ML IJ SOLN: 5.0000 mg | Freq: Three times a day (TID) | INTRAMUSCULAR | Status: DC | PRN

## 2021-06-25 MED ORDER — BUPIVACAINE LIPOSOME 1.3 % IJ SUSP
INTRAMUSCULAR | Status: DC | PRN
  Administered 2021-06-25: 10 mL via PERINEURAL

## 2021-06-25 MED ORDER — EPHEDRINE SULFATE 50 MG/ML IJ SOLN
INTRAMUSCULAR | Status: DC | PRN
  Administered 2021-06-25 (×2): 10 mg via INTRAVENOUS
  Administered 2021-06-25: 5 mg via INTRAVENOUS

## 2021-06-25 MED ORDER — LACTATED RINGERS IV SOLN
INTRAVENOUS | Status: DC | PRN
  Administered 2021-06-25: 3001 mL

## 2021-06-25 MED ORDER — ACETAMINOPHEN 500 MG PO TABS: 1000.0000 mg | ORAL_TABLET | Freq: Four times a day (QID) | ORAL | Status: DC

## 2021-06-25 MED ORDER — FENTANYL CITRATE (PF) 100 MCG/2ML IJ SOLN
INTRAMUSCULAR | Status: AC
  Filled 2021-06-25: qty 2

## 2021-06-25 MED ORDER — MIDAZOLAM HCL 2 MG/2ML IJ SOLN
INTRAMUSCULAR | Status: AC
  Administered 2021-06-25: 1 mg via INTRAVENOUS
  Filled 2021-06-25: qty 2

## 2021-06-25 MED ORDER — OXYCODONE HCL 5 MG PO TABS: 5.0000 mg | ORAL_TABLET | ORAL | Status: DC | PRN

## 2021-06-25 MED ORDER — KETOROLAC TROMETHAMINE 30 MG/ML IJ SOLN
INTRAMUSCULAR | Status: DC | PRN
  Administered 2021-06-25: 30 mg via INTRAVENOUS

## 2021-06-25 MED ORDER — PHENYLEPHRINE HCL-NACL 20-0.9 MG/250ML-% IV SOLN
INTRAVENOUS | Status: DC | PRN
  Administered 2021-06-25: 20 ug/min via INTRAVENOUS

## 2021-06-25 MED ORDER — ROCURONIUM BROMIDE 10 MG/ML (PF) SYRINGE
PREFILLED_SYRINGE | INTRAVENOUS | Status: AC
  Filled 2021-06-25: qty 10

## 2021-06-25 MED ORDER — ACETAMINOPHEN 10 MG/ML IV SOLN
INTRAVENOUS | Status: DC | PRN
  Administered 2021-06-25: 1000 mg via INTRAVENOUS

## 2021-06-25 MED ORDER — LIDOCAINE HCL (PF) 1 % IJ SOLN
INTRAMUSCULAR | Status: DC | PRN
  Administered 2021-06-25: 3 mL via SUBCUTANEOUS

## 2021-06-25 MED ORDER — EPINEPHRINE PF 1 MG/ML IJ SOLN
INTRAMUSCULAR | Status: AC
  Filled 2021-06-25: qty 1

## 2021-06-25 MED ORDER — BUPIVACAINE-EPINEPHRINE (PF) 0.5% -1:200000 IJ SOLN
INTRAMUSCULAR | Status: AC
  Filled 2021-06-25: qty 30

## 2021-06-25 MED ORDER — LIDOCAINE HCL (CARDIAC) PF 100 MG/5ML IV SOSY
PREFILLED_SYRINGE | INTRAVENOUS | Status: DC | PRN
  Administered 2021-06-25: 100 mg via INTRAVENOUS

## 2021-06-25 MED ORDER — SEVOFLURANE IN SOLN
RESPIRATORY_TRACT | Status: AC
  Filled 2021-06-25: qty 250

## 2021-06-25 MED ORDER — ROCURONIUM BROMIDE 100 MG/10ML IV SOLN
INTRAVENOUS | Status: DC | PRN
  Administered 2021-06-25: 20 mg via INTRAVENOUS
  Administered 2021-06-25: 50 mg via INTRAVENOUS

## 2021-06-25 MED ORDER — PROPOFOL 10 MG/ML IV BOLUS
INTRAVENOUS | Status: DC | PRN
  Administered 2021-06-25: 150 mg via INTRAVENOUS

## 2021-06-25 MED ORDER — FENTANYL CITRATE (PF) 100 MCG/2ML IJ SOLN: 25.0000 ug | INTRAMUSCULAR | Status: DC | PRN

## 2021-06-25 MED ORDER — ONDANSETRON HCL 4 MG PO TABS: 4.0000 mg | ORAL_TABLET | Freq: Four times a day (QID) | ORAL | Status: DC | PRN

## 2021-06-25 MED ORDER — BUPIVACAINE HCL (PF) 0.5 % IJ SOLN
INTRAMUSCULAR | Status: AC
  Filled 2021-06-25: qty 20

## 2021-06-25 MED ORDER — SUGAMMADEX SODIUM 200 MG/2ML IV SOLN
INTRAVENOUS | Status: DC | PRN
  Administered 2021-06-25: 200 mg via INTRAVENOUS

## 2021-06-25 MED ORDER — CHLORHEXIDINE GLUCONATE 0.12 % MT SOLN
OROMUCOSAL | Status: AC
  Filled 2021-06-25: qty 15

## 2021-06-25 MED ORDER — SODIUM CHLORIDE 0.9 % IV SOLN: INTRAVENOUS | Status: DC

## 2021-06-25 MED ORDER — PHENYLEPHRINE HCL (PRESSORS) 10 MG/ML IV SOLN
INTRAVENOUS | Status: DC | PRN
  Administered 2021-06-25 (×2): 80 ug via INTRAVENOUS

## 2021-06-25 MED ORDER — BUPIVACAINE HCL (PF) 0.5 % IJ SOLN
INTRAMUSCULAR | Status: DC | PRN
  Administered 2021-06-25: 20 mL via PERINEURAL

## 2021-06-25 MED ORDER — LIDOCAINE HCL (PF) 2 % IJ SOLN
INTRAMUSCULAR | Status: AC
  Filled 2021-06-25: qty 5

## 2021-06-25 MED ORDER — KETOROLAC TROMETHAMINE 30 MG/ML IJ SOLN
INTRAMUSCULAR | Status: AC
  Filled 2021-06-25: qty 1

## 2021-06-25 MED ORDER — 0.9 % SODIUM CHLORIDE (POUR BTL) OPTIME
TOPICAL | Status: DC | PRN
  Administered 2021-06-25: 500 mL

## 2021-06-25 MED ORDER — CEFAZOLIN SODIUM-DEXTROSE 2-4 GM/100ML-% IV SOLN
INTRAVENOUS | Status: AC
  Filled 2021-06-25: qty 100

## 2021-06-25 MED ORDER — BUPIVACAINE-EPINEPHRINE (PF) 0.25% -1:200000 IJ SOLN
INTRAMUSCULAR | Status: AC
  Filled 2021-06-25: qty 30

## 2021-06-25 MED ORDER — BUPIVACAINE-EPINEPHRINE 0.5% -1:200000 IJ SOLN
INTRAMUSCULAR | Status: DC | PRN
  Administered 2021-06-25: 30 mL

## 2021-06-25 MED ORDER — ONDANSETRON HCL 4 MG/2ML IJ SOLN: 4.0000 mg | Freq: Once | INTRAMUSCULAR | Status: DC | PRN

## 2021-06-25 MED ORDER — LIDOCAINE HCL (PF) 1 % IJ SOLN
INTRAMUSCULAR | Status: AC
  Filled 2021-06-25: qty 5

## 2021-06-25 MED ORDER — METOCLOPRAMIDE HCL 10 MG PO TABS: 5.0000 mg | ORAL_TABLET | Freq: Three times a day (TID) | ORAL | Status: DC | PRN

## 2021-06-25 MED ORDER — DEXAMETHASONE SODIUM PHOSPHATE 10 MG/ML IJ SOLN
INTRAMUSCULAR | Status: DC | PRN
  Administered 2021-06-25: 10 mg via INTRAVENOUS

## 2021-06-25 MED ORDER — ACETAMINOPHEN 10 MG/ML IV SOLN
INTRAVENOUS | Status: AC
  Filled 2021-06-25: qty 100

## 2021-06-25 SURGICAL SUPPLY — 53 items
ANCHOR ALL-SUT Q-FIX 2.8 (Anchor) ×4 IMPLANT
ANCHOR HEALICOIL REGEN 5.5 (Anchor) ×4 IMPLANT
BIT DRILL JUGRKNT W/NDL BIT2.9 (DRILL) IMPLANT
BLADE FULL RADIUS 3.5 (BLADE) ×2 IMPLANT
BUR ACROMIONIZER 4.0 (BURR) ×2 IMPLANT
CANNULA SHAVER 8MMX76MM (CANNULA) ×2 IMPLANT
CHLORAPREP W/TINT 26 (MISCELLANEOUS) ×4 IMPLANT
COVER MAYO STAND REUSABLE (DRAPES) ×2 IMPLANT
DRAPE IMP U-DRAPE 54X76 (DRAPES) ×4 IMPLANT
DRILL JUGGERKNOT W/NDL BIT 2.9 (DRILL)
DRSG PAD ABDOMINAL 8X10 ST (GAUZE/BANDAGES/DRESSINGS) ×2 IMPLANT
ELECT CAUTERY BLADE 6.4 (BLADE) ×2 IMPLANT
ELECT REM PT RETURN 9FT ADLT (ELECTROSURGICAL) ×2
ELECTRODE REM PT RTRN 9FT ADLT (ELECTROSURGICAL) ×1 IMPLANT
GAUZE SPONGE 4X4 12PLY STRL (GAUZE/BANDAGES/DRESSINGS) ×2 IMPLANT
GAUZE SPONGE 4X4 16PLY XRAY LF (GAUZE/BANDAGES/DRESSINGS) ×2 IMPLANT
GAUZE XEROFORM 1X8 LF (GAUZE/BANDAGES/DRESSINGS) ×2 IMPLANT
GLOVE SRG 8 PF TXTR STRL LF DI (GLOVE) ×1 IMPLANT
GLOVE SURG ENC MOIS LTX SZ7.5 (GLOVE) ×4 IMPLANT
GLOVE SURG ENC MOIS LTX SZ8 (GLOVE) ×4 IMPLANT
GLOVE SURG UNDER LTX SZ8 (GLOVE) ×2 IMPLANT
GLOVE SURG UNDER POLY LF SZ8 (GLOVE) ×1
GOWN STRL REUS W/ TWL LRG LVL3 (GOWN DISPOSABLE) ×1 IMPLANT
GOWN STRL REUS W/ TWL XL LVL3 (GOWN DISPOSABLE) ×1 IMPLANT
GOWN STRL REUS W/TWL LRG LVL3 (GOWN DISPOSABLE) ×1
GOWN STRL REUS W/TWL XL LVL3 (GOWN DISPOSABLE) ×1
GRASPER SUT 15 45D LOW PRO (SUTURE) IMPLANT
IV LACTATED RINGER IRRG 3000ML (IV SOLUTION) ×2
IV LR IRRIG 3000ML ARTHROMATIC (IV SOLUTION) ×2 IMPLANT
KIT CANNULA 8X76-LX IN CANNULA (CANNULA) IMPLANT
KIT SUTURE 2.8 Q-FIX DISP (MISCELLANEOUS) ×2 IMPLANT
MANIFOLD NEPTUNE II (INSTRUMENTS) ×2 IMPLANT
MASK FACE SPIDER DISP (MASK) ×2 IMPLANT
MAT ABSORB  FLUID 56X50 GRAY (MISCELLANEOUS) ×1
MAT ABSORB FLUID 56X50 GRAY (MISCELLANEOUS) ×1 IMPLANT
NEEDLE MAYO 6 CRC TAPER PT (NEEDLE) ×2 IMPLANT
PACK ARTHROSCOPY SHOULDER (MISCELLANEOUS) ×2 IMPLANT
PAD ABD DERMACEA PRESS 5X9 (GAUZE/BANDAGES/DRESSINGS) ×4 IMPLANT
PASSER SUT FIRSTPASS SELF (INSTRUMENTS) ×2 IMPLANT
SLING ARM LRG DEEP (SOFTGOODS) IMPLANT
SLING ULTRA II LG (MISCELLANEOUS) ×2 IMPLANT
SPONGE T-LAP 18X18 ~~LOC~~+RFID (SPONGE) ×2 IMPLANT
STAPLER SKIN PROX 35W (STAPLE) ×2 IMPLANT
STRAP SAFETY 5IN WIDE (MISCELLANEOUS) ×2 IMPLANT
SUT ETHIBOND 0 MO6 C/R (SUTURE) ×2 IMPLANT
SUT ULTRABRAID 2 COBRAID 38 (SUTURE) ×10 IMPLANT
SUT VIC AB 2-0 CT1 27 (SUTURE) ×2
SUT VIC AB 2-0 CT1 TAPERPNT 27 (SUTURE) ×2 IMPLANT
TAPE MICROFOAM 4IN (TAPE) ×2 IMPLANT
TUBING CONNECTING 10 (TUBING) ×2 IMPLANT
TUBING INFLOW SET DBFLO PUMP (TUBING) ×2 IMPLANT
WAND WEREWOLF FLOW 90D (MISCELLANEOUS) ×2 IMPLANT
WATER STERILE IRR 500ML POUR (IV SOLUTION) ×2 IMPLANT

## 2021-06-25 NOTE — H&P (Signed)
History of resent Illness:  Dylan Chen is a 65 y.o. male who presents today as a result of a referral from Vance Peper, [place], for left shoulder pain and weakness.   The patient's symptoms began several months ago and developed as a result of an injury when he apparently fell off his ladder onto his left arm. He saw Vance Peper, [place], last month who ordered an MRI scan and referred him to me for further evaluation and treatment. The patient describes the symptoms as marked (major pain with significant limitations) and have the quality of being aching, constant, miserable, nagging, stabbing, tender and throbbing. The pain is localized to the lateral arm/shoulder. These symptoms are aggravated with normal daily activities, with sleeping, at higher levels of activity, with overhead activity and reaching behind the back. He has tried non-steroidal anti-inflammatories (Aleve and Mobic) with limited benefit. He has tried rest with no significant benefit. He has not tried any physical therapy or steroid injections. The patient denies any prior problems with his left shoulder. He denies any neck pain, nor does he note any numbness or paresthesias down his arm to his hand.  This complaint is not work related. He is a sports non-participant.  Shoulder Surgical History:  The patient has had a rotator cuff repair and a decompression on his right shoulder in the past, but has not had any prior surgery on his left shoulder.  PMH/PSH/Family History/Social History/Meds/Allergies:  I have reviewed past medical, surgical, social and family history, medications and allergies as documented in the EMR.  Current Outpatient Medications:  ascorbic acid (VITAMIN C) 500 MG tablet Take 1,000 mg by mouth once daily   aspirin 81 MG EC tablet Take 81 mg by mouth once daily.   atorvastatin (LIPITOR) 20 MG tablet TAKE 1 TABLET BY MOUTH EVERY DAY 90 tablet 3   CALCIUM CARBONATE/VITAMIN D3 (CALCIUM 600 + D,3, ORAL) Take 500 mg by mouth  once daily   cholecalciferol (VITAMIN D3) 1000 unit tablet Take by mouth once daily   clobetasoL (TEMOVATE) 0.05 % cream Apply topically 2 (two) times daily 30 g 0   cyanocobalamin (VITAMIN B12) 1000 MCG tablet Take by mouth once daily   docosahexanoic acid/epa (FISH OIL ORAL) Take 1,200 mg by mouth once daily   donepeziL (ARICEPT) 10 MG tablet Take 1 tablet (10 mg total) by mouth nightly 90 tablet 3   levothyroxine (SYNTHROID) 137 MCG tablet Take 1 tablet (137 mcg total) by mouth once daily Take on an empty stomach with a glass of water at least 30-60 minutes before breakfast. 90 tablet 3   multivitamin capsule Take 1 capsule by mouth once daily.   saw palmetto 500 MG capsule Take 900 mg by mouth 2 (two) times daily   vitamin E acetate (VITAMIN E ORAL) Take 1 tablet by mouth once daily   Allergies:   Codeine Other (Makes feel weird)   Past Medical History:   Arthritis   GERD (gastroesophageal reflux disease)   Hyperlipidemia   Hypertension   Peptic ulcer   Post-surgical hypothyroidism   Sleep apnea (on CPAP)   Thyroid cancer (CMS-HCC) 2009   Past Surgical History:   COLONOSCOPY 01/14/2005 (Dr. Ivor Messier @ [facility], FHPolyps(m), rpt 5 yrs)   COLONOSCOPY 12/19/2017 [facility] (Mother) CBF 12/2022)   HERNIA REPAIR Left 06/16/2017 (Dr Rochel Brome)   hiatus hernia repair [place] Right 12/29/2020 (Dr Peyton Najjar - ROBOTIC)   Left THA 2010    Rotator  cuff repair Right 2012   THYROIDECTOMY TOTAL 2009 (Dr. Ayesha Mohair)   UPPER GASTROINTESTINAL ENDOSCOPY   Family History:   Pancreatic cancer Mother   Thyroid disease Mother 25 (cancer)   Colon polyps Mother   Lung cancer Father   Diabetes type II Maternal Grandmother   Social History:   Socioeconomic History:   Marital status: Married  Tobacco Use   Smoking status: Former Smoker  Quit date: 02/13/1995  Years since quitting: 26.3   Smokeless tobacco: Never Used  Vaping Use   Vaping Use: Never used  Substance and  Sexual Activity   Alcohol use: No   Drug use: Not Currently   Sexual activity: Not Currently  Partners: Female   Review of Systems:  A comprehensive 14 point ROS was performed, reviewed, and the pertinent orthopaedic findings are documented in the HPI.  Physical Exam:  Vitals:  06/12/21 0919  BP: 116/74  Weight: 77.5 kg (170 lb 12.8 oz)  Height: 165.1 cm (5\' 5" )  PainSc: 1  PainLoc: Shoulder   General/Constitutional: The patient appears to be well-nourished, well-developed, and in no acute distress. Neuro/Psych: Normal mood and affect, oriented to person, place and time. Eyes: Non-icteric. Pupils are equal, round, and reactive to light, and exhibit synchronous movement. ENT: Unremarkable. Lymphatic: No palpable adenopathy. Respiratory: Lungs clear to auscultation, Normal chest excursion, No wheezes and Non-labored breathing Cardiovascular: Regular rate and rhythm. No murmurs. and No edema, swelling or tenderness, except as noted in detailed exam. Integumentary: No impressive skin lesions present, except as noted in detailed exam. Musculoskeletal: Unremarkable, except as noted in detailed exam.  Left shoulder exam: SKIN: normal SWELLING: none WARMTH: none LYMPH NODES: no adenopathy palpable CREPITUS: none TENDERNESS: Mildly tender over the lateral acromion ROM (active):  Forward flexion: 95 degrees Abduction: 90 degrees Internal rotation: Refuses to do ROM (passive):  Forward flexion: 150 degrees Abduction: 135 degrees ER/IR at 90 abd: 90 degrees / 45 degrees  He has moderate pain with forward flexion, abduction, and internal rotation at 90 degrees of abduction.  STRENGTH: Forward flexion: 3-3+/5 Abduction: 3-3+/5 External rotation: 3+-4/5 Internal rotation: 4/5 Pain with RC testing: Moderate pain with resisted abduction and forward flexion, and mild pain with resisted internal and external rotation  STABILITY: Normal  SPECIAL TESTS: Luan Pulling' test: positive,  mild Speed's test: negative Capsulitis - pain w/ passive ER: no Crossed arm test: Mildly positive Crank: Not evaluated Anterior apprehension: Negative Posterior apprehension: Not evaluated  He is neurovascularly intact to the left upper extremity.  Imaging:  Recent true AP, Y-scapular, and axillary views of the left shoulder are available for review and have been reviewed by myself. These films demonstrate at most minimal degenerative changes as manifest by a very small inferior humeral osteophyte, but no joint space narrowing. The subacromial space is mildly decreased. There is no subacromial or infra-clavicular spurring. He demonstrates a Type II acromion.  Shoulder Imaging, MRI: Left Shoulder: MRI Shoulder Cartilage: Partial thickness humeral head cartilage loss. MRI Shoulder Rotator Cuff: Full thickness tear of the supraspinatus. Retracted to the glenohumeral joint. Significant partial-thickness tearing of the subscapularis tendon. MRI Shoulder Labrum / Biceps: The long head of the biceps is torn and retracted distally MRI Shoulder Bone: Normal bone.  Both the films and report were reviewed by myself and discussed with the patient and his wife.  Assessment:   Traumatic complete tear of left rotator cuff.   Rotator cuff tendinitis, left.   Plan:  The treatment options were discussed with the patient and  his wife. In addition, patient educational materials were provided regarding the diagnosis and treatment options. The patient is quite frustrated by his symptoms and functional limitations, and is ready to consider more aggressive treatment options. Therefore, I have recommended a surgical procedure, specifically a left shoulder arthroscopy with debridement, decompression, and rotator cuff repair. The procedure was discussed with the patient, as were the potential risks (including bleeding, infection, nerve and/or blood vessel injury, persistent or recurrent pain, failure of the repair,  progression of arthritis, need for further surgery, blood clots, strokes, heart attacks and/or arhythmias, pneumonia, etc.) and benefits. The patient states his understanding and wishes to proceed. All of the patient's questions and concerns were answered. He can call any time with further concerns. He will follow up post-surgery, routine.   H&P reviewed and patient re-examined. No changes.

## 2021-06-25 NOTE — Transfer of Care (Signed)
Immediate Anesthesia Transfer of Care Note  Patient: Dylan Chen  Procedure(s) Performed: SHOULDER ARTHROSCOPY WITH DEBRIDEMENT, DECOMPRESSION, AND ROTATOR CUFF REPAIR. (Left: Shoulder)  Patient Location: PACU  Anesthesia Type:General  Level of Consciousness: awake, alert , oriented and patient cooperative  Airway & Oxygen Therapy: Patient Spontanous Breathing and Patient connected to face mask oxygen  Post-op Assessment: Report given to RN and Post -op Vital signs reviewed and stable  Post vital signs: Reviewed and stable  Last Vitals:  Vitals Value Taken Time  BP 128/70 06/25/21 1630  Temp 36.4 C 06/25/21 1623  Pulse 75 06/25/21 1638  Resp 17 06/25/21 1638  SpO2 94 % 06/25/21 1638    Last Pain:  Vitals:   06/25/21 1623  TempSrc:   PainSc: 0-No pain         Complications: No notable events documented.

## 2021-06-25 NOTE — Anesthesia Procedure Notes (Signed)
Anesthesia Regional Block: Interscalene brachial plexus block   Pre-Anesthetic Checklist: , timeout performed,  Correct Patient, Correct Site, Correct Laterality,  Correct Procedure, Correct Position, site marked,  Risks and benefits discussed,  Surgical consent,  Pre-op evaluation,  At surgeon's request and post-op pain management  Laterality: Left  Prep: chloraprep       Needles:  Injection technique: Single-shot  Needle Type: Echogenic Stimulator Needle     Needle Length: 10cm  Needle Gauge: 20     Additional Needles:   Procedures:, nerve stimulator,,, ultrasound used (permanent image in chart),,     Nerve Stimulator or Paresthesia:  Response: biceps flexion  Additional Responses:   Narrative:  End time: 06/25/2021 12:38 PM Injection made incrementally with aspirations every 5 mL.  Performed by: Personally   Additional Notes: Functioning IV was confirmed and monitors were applied.  Sterile prep and drape,hand hygiene and sterile gloves were used.  Negative aspiration and negative test dose prior to incremental administration of local anesthetic. The patient tolerated the procedure well.

## 2021-06-25 NOTE — Anesthesia Preprocedure Evaluation (Signed)
Anesthesia Evaluation  Patient identified by MRN, date of birth, ID band Patient awake    Reviewed: Allergy & Precautions, H&P , NPO status , Patient's Chart, lab work & pertinent test results, reviewed documented beta blocker date and time   Airway Mallampati: I  TM Distance: >3 FB Neck ROM: full    Dental  (+) Teeth Intact   Pulmonary sleep apnea and Continuous Positive Airway Pressure Ventilation , former smoker,    Pulmonary exam normal        Cardiovascular Exercise Tolerance: Good hypertension, On Medications negative cardio ROS Normal cardiovascular exam Rhythm:regular Rate:Normal     Neuro/Psych PSYCHIATRIC DISORDERS Dementia negative neurological ROS  negative psych ROS   GI/Hepatic negative GI ROS, Neg liver ROS, hiatal hernia, PUD, GERD  Medicated,  Endo/Other  negative endocrine ROSHypothyroidism   Renal/GU negative Renal ROS  negative genitourinary   Musculoskeletal   Abdominal   Peds  Hematology negative hematology ROS (+)   Anesthesia Other Findings Past Medical History: No date: Arthritis     Comment:  RIGHT HAND No date: Cancer ([place])     Comment:  THYROID No date: Dementia (HCC) No date: GERD (gastroesophageal reflux disease) No date: History of hiatal hernia No date: Hyperlipemia No date: Hypertension No date: Hypothyroidism No date: Peptic ulcer No date: Sleep apnea     Comment:  CPAP Past Surgical History: 12/19/2017: COLONOSCOPY WITH PROPOFOL; N/A     Comment:  Procedure: COLONOSCOPY WITH PROPOFOL;  Surgeon: Manya Silvas, MD;  Location: [facility] ENDOSCOPY;  Service:               Endoscopy;  Laterality: N/A; No date: HIATAL HERNIA REPAIR 06/16/2017: INGUINAL HERNIA REPAIR; Left     Comment:  Procedure: HERNIA REPAIR INGUINAL ADULT;  Surgeon:               Leonie Green, MD;  Location: [facility] ORS;  Service:               General;  Laterality: Left; No date: JOINT  REPLACEMENT; Left     Comment:  THR No date: ROTATOR CUFF REPAIR; Right No date: THYROIDECTOMY 12/29/2020: XI ROBOTIC ASSISTED INGUINAL HERNIA REPAIR WITH MESH; Right     Comment:  Procedure: XI ROBOTIC ASSISTED INGUINAL HERNIA REPAIR               WITH MESH;  Surgeon: Herbert Pun, MD;                Location: [facility] ORS;  Service: General;  Laterality:               Right; BMI    Body Mass Index: 28.29 kg/m     Reproductive/Obstetrics negative OB ROS                             Anesthesia Physical Anesthesia Plan  ASA: 3  Anesthesia Plan: General ETT   Post-op Pain Management:  Regional for Post-op pain   Induction:   PONV Risk Score and Plan: 3  Airway Management Planned:   Additional Equipment:   Intra-op Plan:   Post-operative Plan:   Informed Consent: I have reviewed the patients History and Physical, chart, labs and discussed the procedure including the risks, benefits and alternatives for the proposed anesthesia with the patient or authorized representative who has indicated his/her understanding and acceptance.  Dental Advisory Given  Plan Discussed with: CRNA  Anesthesia Plan Comments:         Anesthesia Quick Evaluation

## 2021-06-25 NOTE — Discharge Instructions (Addendum)
Orthopedic discharge instructions: Keep dressing dry and intact.  May sponge bathe after dressing changed on post-op day #4 (Monday).  Cover staples with Band-Aids after drying off. Apply ice frequently to shoulder. Take Aleve 2 tabs BID OR ibuprofen 600-800 mg TID with meals for 7-10 days, then as necessary. Take oxycodone as prescribed when needed.  May supplement with ES Tylenol if necessary. Keep shoulder immobilizer on at all times except may loosen straps for sponge bathing. Follow-up in 10-14 days or as scheduled.  AMBULATORY SURGERY  DISCHARGE INSTRUCTIONS   The drugs that you were given will stay in your system until tomorrow so for the next 24 hours you should not:  Drive an automobile Make any legal decisions Drink any alcoholic beverage   You may resume regular meals tomorrow.  Today it is better to start with liquids and gradually work up to solid foods.  You may eat anything you prefer, but it is better to start with liquids, then soup and crackers, and gradually work up to solid foods.   Please notify your doctor immediately if you have any unusual bleeding, trouble breathing, redness and pain at the surgery site, drainage, fever, or pain not relieved by medication.    Additional Instructions:  Please contact your physician with any problems or Same Day Surgery at [phone], Monday through Friday 6 am to 4 pm, or [place] at [facility] number at [phone].

## 2021-06-25 NOTE — Op Note (Signed)
06/25/2021  4:20 PM  Patient:   Dylan Chen  Pre-Op Diagnosis:   Impingement/tendinopathy with large traumatic full-thickness rotator cuff tear, left shoulder.  Post-Op Diagnosis:   Same with labral fraying, left shoulder.  Procedure:   Extensive arthroscopic debridement, arthroscopic subacromial decompression, and mini-open rotator cuff repair, left shoulder.  Anesthesia:   General endotracheal with interscalene block using Exparel placed preoperatively by the anesthesiologist.  Surgeon:   Pascal Lux, MD  Assistant:   Cameron Proud, PA-C; Rennis Chris, PA-S  Findings:   As above. There was a full-thickness tear of the upper 70% of the subscapularis tendon as well as a type II traumatic tear extending posteromedially through the mid substance fibers of the supraspinatus tendon into the mid substance fibers of the anterior half of the infraspinatus tendon. The biceps tendon had torn and retracted distally. There was moderate labral fraying anteriorly and superiorly without frank detachment from the glenoid. The articular surfaces of the glenoid and humerus both were in satisfactory condition.  Complications:   None  Fluids:   700 cc  Estimated blood loss:   10 cc  Tourniquet time:   None  Drains:   None  Closure:   Staples      Brief clinical note:   The patient is a 65 year old male who sustained the above-noted injury 2 to 3 months ago when he fell about 6 to 8 feet from his ladder. The patient's symptoms have progressed despite medications, activity modification, etc. The patient's history and examination are consistent with a traumatic rotator cuff tear. These findings were confirmed by MRI scan. The patient presents at this time for definitive management of these shoulder symptoms.  Procedure:   The patient underwent placement of an interscalene block using Exparel by the anesthesiologist in the preoperative holding area before being brought into the operating room and  lain in the supine position. The patient then underwent general endotracheal intubation and anesthesia before being repositioned in the beach chair position using the beach chair positioner. The left shoulder and upper extremity were prepped with ChloraPrep solution before being draped sterilely. Preoperative antibiotics were administered. A timeout was performed to confirm the proper surgical site before the expected portal sites and incision site were injected with 0.5% Sensorcaine with epinephrine.   A posterior portal was created and the glenohumeral joint thoroughly inspected with the findings as described above. An anterior portal was created using an outside-in technique. The labrum and rotator cuff were further probed, again confirming the above-noted findings. The areas of labral fraying superiorly and anteriorly were debrided back to stable margins using the full-radius resector.  In addition, the frayed margins of the rotator cuff tears also were debrided back to stable margins using the full-radius resector. Finally, areas of synovitis anteriorly and superiorly were debrided back to stable margins using the full-radius resector. The ArthroCare wand was inserted and used to obtain hemostasis as well as to "anneal" the labrum superiorly and anteriorly. The instruments were removed from the joint after suctioning the excess fluid.  The camera was repositioned through the posterior portal into the subacromial space. A separate lateral portal was created using an outside-in technique. The 3.5 mm full-radius resector was introduced and used to perform a subtotal bursectomy. The ArthroCare wand was then inserted and used to remove the periosteal tissue off the undersurface of the anterior third of the acromion as well as to recess the coracoacromial ligament from its attachment along the anterior and lateral margins of the  acromion. The 4.0 mm acromionizing bur was introduced and used to complete the  decompression by removing the undersurface of the anterior third of the acromion. The full radius resector was reintroduced to remove any residual bony debris before the ArthroCare wand was reintroduced to obtain hemostasis. The instruments were then removed from the subacromial space after suctioning the excess fluid.  An approximately 4-5 cm incision was made over the anterolateral aspect of the shoulder beginning at the anterolateral corner of the acromion and extending distally in line with the bicipital groove. This incision was carried down through the subcutaneous tissues to expose the deltoid fascia. The raphae between the anterior and middle thirds was identified and this plane developed to provide access into the subacromial space. Additional bursal tissues were debrided sharply using Metzenbaum scissors. The rotator cuff tear was readily identified. The margins were debrided sharply with a #15 blade.    The subscapularis tear was repaired first. The exposed lesser tuberosity was roughened with a rongeur. The tear was repaired using two [employer] 2.8 mm Q-Fix anchors. These sutures were passed through the medial margin of the tendon and tied securely. The ends of the sutures were then brought back laterally and secured using two [place]gin knotless RegeneSorb anchors to create a two-layer closure.   The Type II oblique tear extending posteromedially in a diagonal direction through the supraspinatus and infraspinatus tendons was repaired in a side-to-side fashion using multiple #2 FiberWire sutures placed in a side-to-side fashion using the [employer] first pass device. Several additional #0 Ethibond interrupted sutures were used to reinforce this repair. An apparent watertight closure was obtained.  The wound was copiously irrigated with sterile saline solution before the deltoid raphae was reapproximated using 2-0 Vicryl interrupted sutures. The subcutaneous tissues were  closed in two layers using 2-0 Vicryl interrupted sutures before the skin was closed using staples. The portal sites also were closed using staples. A sterile bulky dressing was applied to the shoulder before the arm was placed into a shoulder immobilizer. The patient was then awakened, extubated, and returned to the recovery room in satisfactory condition after tolerating the procedure well.

## 2021-06-25 NOTE — Anesthesia Procedure Notes (Signed)
Procedure Name: Intubation Date/Time: 06/25/2021 1:57 PM Performed by: Debe Coder, CRNA Pre-anesthesia Checklist: Patient identified, Emergency Drugs available, Suction available and Patient being monitored Patient Re-evaluated:Patient Re-evaluated prior to induction Oxygen Delivery Method: Circle system utilized Preoxygenation: Pre-oxygenation with 100% oxygen Induction Type: IV induction Ventilation: Mask ventilation without difficulty Tube type: Oral Tube size: 7.5 mm Number of attempts: 1 Airway Equipment and Method: Stylet and Oral airway Placement Confirmation: ETT inserted through vocal cords under direct vision, positive ETCO2 and breath sounds checked- equal and bilateral Secured at: 21 cm Tube secured with: Tape Dental Injury: Teeth and Oropharynx as per pre-operative assessment

## 2021-06-26 ENCOUNTER — Encounter: Payer: Self-pay | Admitting: Surgery

## 2021-06-26 NOTE — Anesthesia Postprocedure Evaluation (Signed)
Anesthesia Post Note  Patient: Dylan Chen  Procedure(s) Performed: SHOULDER ARTHROSCOPY WITH DEBRIDEMENT, DECOMPRESSION, AND ROTATOR CUFF REPAIR. (Left: Shoulder)  Patient location during evaluation: PACU Anesthesia Type: General Level of consciousness: awake and alert Pain management: pain level controlled Vital Signs Assessment: post-procedure vital signs reviewed and stable Respiratory status: spontaneous breathing, nonlabored ventilation, respiratory function stable and patient connected to nasal cannula oxygen Cardiovascular status: blood pressure returned to baseline and stable Postop Assessment: no apparent nausea or vomiting Anesthetic complications: no   No notable events documented.   Last Vitals:  Vitals:   06/25/21 1705 06/25/21 1709  BP:  (!) 147/83  Pulse: 70 84  Resp: 17 16  Temp:  (!) 36.1 C  SpO2: 96% 96%    Last Pain:  Vitals:   06/25/21 1709  TempSrc: Temporal  PainSc: 0-No pain                 Martha Clan

## 2021-07-21 ENCOUNTER — Other Ambulatory Visit: Payer: Self-pay

## 2021-07-21 DIAGNOSIS — K5730 Diverticulosis of large intestine without perforation or abscess without bleeding: Secondary | ICD-10-CM | POA: Insufficient documentation

## 2021-07-21 DIAGNOSIS — I10 Essential (primary) hypertension: Secondary | ICD-10-CM | POA: Insufficient documentation

## 2021-07-21 DIAGNOSIS — Z20822 Contact with and (suspected) exposure to covid-19: Secondary | ICD-10-CM | POA: Diagnosis not present

## 2021-07-21 DIAGNOSIS — K641 Second degree hemorrhoids: Principal | ICD-10-CM | POA: Insufficient documentation

## 2021-07-21 DIAGNOSIS — E039 Hypothyroidism, unspecified: Secondary | ICD-10-CM | POA: Diagnosis not present

## 2021-07-21 DIAGNOSIS — Z85850 Personal history of malignant neoplasm of thyroid: Secondary | ICD-10-CM | POA: Insufficient documentation

## 2021-07-21 DIAGNOSIS — Z87891 Personal history of nicotine dependence: Secondary | ICD-10-CM | POA: Diagnosis not present

## 2021-07-21 DIAGNOSIS — Z7982 Long term (current) use of aspirin: Secondary | ICD-10-CM | POA: Diagnosis not present

## 2021-07-21 DIAGNOSIS — Z79899 Other long term (current) drug therapy: Secondary | ICD-10-CM | POA: Insufficient documentation

## 2021-07-21 DIAGNOSIS — F0390 Unspecified dementia without behavioral disturbance: Secondary | ICD-10-CM | POA: Diagnosis not present

## 2021-07-21 DIAGNOSIS — Z96642 Presence of left artificial hip joint: Secondary | ICD-10-CM | POA: Insufficient documentation

## 2021-07-21 DIAGNOSIS — K625 Hemorrhage of anus and rectum: Secondary | ICD-10-CM | POA: Diagnosis present

## 2021-07-21 DIAGNOSIS — K633 Ulcer of intestine: Secondary | ICD-10-CM | POA: Diagnosis not present

## 2021-07-21 LAB — COMPREHENSIVE METABOLIC PANEL
ALT: 25 U/L (ref 0–44)
AST: 31 U/L (ref 15–41)
Albumin: 4.3 g/dL (ref 3.5–5.0)
Alkaline Phosphatase: 84 U/L (ref 38–126)
Anion gap: 9 (ref 5–15)
BUN: 17 mg/dL (ref 8–23)
CO2: 27 mmol/L (ref 22–32)
Calcium: 9.2 mg/dL (ref 8.9–10.3)
Chloride: 103 mmol/L (ref 98–111)
Creatinine, Ser: 1.14 mg/dL (ref 0.61–1.24)
GFR, Estimated: >60 mL/min (ref >60)
Glucose, Bld: 173 mg/dL — ABNORMAL HIGH (ref 70–99)
Potassium: 3.7 mmol/L (ref 3.5–5.1)
Sodium: 139 mmol/L (ref 135–145)
Total Bilirubin: 1.8 mg/dL — ABNORMAL HIGH (ref 0.3–1.2)
Total Protein: 6.6 g/dL (ref 6.5–8.1)

## 2021-07-21 LAB — CBC
HCT: 41.3 % (ref 39.0–52.0)
Hemoglobin: 14.5 g/dL (ref 13.0–17.0)
MCH: 33.3 pg (ref 26.0–34.0)
MCHC: 35.1 g/dL (ref 30.0–36.0)
MCV: 94.9 fL (ref 80.0–100.0)
Platelets: 228 10*3/uL (ref 150–400)
RBC: 4.35 MIL/uL (ref 4.22–5.81)
RDW: 12 % (ref 11.5–15.5)
WBC: 5.1 10*3/uL (ref 4.0–10.5)
nRBC: 0 % (ref 0.0–0.2)

## 2021-07-21 LAB — PROTIME-INR
INR: 1 (ref 0.8–1.2)
Prothrombin Time: 13.1 seconds (ref 11.4–15.2)

## 2021-07-21 LAB — TYPE AND SCREEN
ABO/RH(D): B NEG
Antibody Screen: NEGATIVE

## 2021-07-21 NOTE — ED Triage Notes (Signed)
Pt presents to ER c/o bright red rectal bleeding x2 days.  Pt states he has hx of hemorrhoids but it has been a while since those have caused him problems.  Pt denies any pain but states he has had increased urgency to go to the bathroom. Pt A&O x4 at this time.

## 2021-07-22 ENCOUNTER — Observation Stay
Admission: EM | Admit: 2021-07-22 | Discharge: 2021-07-23 | Disposition: A | Discharge status: Home / Self Care | Payer: Medicare Other | Attending: Obstetrics and Gynecology | Admitting: Obstetrics and Gynecology

## 2021-07-22 DIAGNOSIS — E785 Hyperlipidemia, unspecified: Secondary | ICD-10-CM | POA: Diagnosis not present

## 2021-07-22 DIAGNOSIS — K922 Gastrointestinal hemorrhage, unspecified: Secondary | ICD-10-CM | POA: Diagnosis present

## 2021-07-22 DIAGNOSIS — K648 Other hemorrhoids: Secondary | ICD-10-CM

## 2021-07-22 DIAGNOSIS — E039 Hypothyroidism, unspecified: Secondary | ICD-10-CM | POA: Diagnosis not present

## 2021-07-22 DIAGNOSIS — G4733 Obstructive sleep apnea (adult) (pediatric): Secondary | ICD-10-CM

## 2021-07-22 DIAGNOSIS — R3914 Feeling of incomplete bladder emptying: Secondary | ICD-10-CM

## 2021-07-22 DIAGNOSIS — N401 Enlarged prostate with lower urinary tract symptoms: Secondary | ICD-10-CM

## 2021-07-22 DIAGNOSIS — E890 Postprocedural hypothyroidism: Secondary | ICD-10-CM

## 2021-07-22 DIAGNOSIS — I10 Essential (primary) hypertension: Secondary | ICD-10-CM | POA: Diagnosis present

## 2021-07-22 LAB — BASIC METABOLIC PANEL
Anion gap: 7 (ref 5–15)
BUN: 19 mg/dL (ref 8–23)
CO2: 27 mmol/L (ref 22–32)
Calcium: 8.8 mg/dL — ABNORMAL LOW (ref 8.9–10.3)
Chloride: 105 mmol/L (ref 98–111)
Creatinine, Ser: 0.95 mg/dL (ref 0.61–1.24)
GFR, Estimated: >60 mL/min (ref >60)
Glucose, Bld: 104 mg/dL — ABNORMAL HIGH (ref 70–99)
Potassium: 3.9 mmol/L (ref 3.5–5.1)
Sodium: 139 mmol/L (ref 135–145)

## 2021-07-22 LAB — CBC WITH DIFFERENTIAL/PLATELET
Abs Immature Granulocytes: 0.01 10*3/uL (ref 0.00–0.07)
Basophils Absolute: 0 10*3/uL (ref 0.0–0.1)
Basophils Relative: 0 %
Eosinophils Absolute: 0.4 10*3/uL (ref 0.0–0.5)
Eosinophils Relative: 5 %
HCT: 41.2 % (ref 39.0–52.0)
Hemoglobin: 14.5 g/dL (ref 13.0–17.0)
Immature Granulocytes: 0 %
Lymphocytes Relative: 23 %
Lymphs Abs: 1.6 10*3/uL (ref 0.7–4.0)
MCH: 33.7 pg (ref 26.0–34.0)
MCHC: 35.2 g/dL (ref 30.0–36.0)
MCV: 95.8 fL (ref 80.0–100.0)
Monocytes Absolute: 0.5 10*3/uL (ref 0.1–1.0)
Monocytes Relative: 8 %
Neutro Abs: 4.2 10*3/uL (ref 1.7–7.7)
Neutrophils Relative %: 64 %
Platelets: 240 10*3/uL (ref 150–400)
RBC: 4.3 MIL/uL (ref 4.22–5.81)
RDW: 12.2 % (ref 11.5–15.5)
WBC: 6.7 10*3/uL (ref 4.0–10.5)
nRBC: 0 % (ref 0.0–0.2)

## 2021-07-22 LAB — RESP PANEL BY RT-PCR (FLU A&B, COVID) ARPGX2
Influenza A by PCR: NEGATIVE
Influenza B by PCR: NEGATIVE
SARS Coronavirus 2 by RT PCR: NEGATIVE

## 2021-07-22 LAB — CBC
HCT: 38.8 % — ABNORMAL LOW (ref 39.0–52.0)
Hemoglobin: 13.6 g/dL (ref 13.0–17.0)
MCH: 33.7 pg (ref 26.0–34.0)
MCHC: 35.1 g/dL (ref 30.0–36.0)
MCV: 96 fL (ref 80.0–100.0)
Platelets: 207 10*3/uL (ref 150–400)
RBC: 4.04 MIL/uL — ABNORMAL LOW (ref 4.22–5.81)
RDW: 12.1 % (ref 11.5–15.5)
WBC: 5 10*3/uL (ref 4.0–10.5)
nRBC: 0 % (ref 0.0–0.2)

## 2021-07-22 LAB — HEMOGLOBIN AND HEMATOCRIT, BLOOD
HCT: 38.3 % — ABNORMAL LOW (ref 39.0–52.0)
Hemoglobin: 13.2 g/dL (ref 13.0–17.0)

## 2021-07-22 LAB — HIV ANTIBODY (ROUTINE TESTING W REFLEX): HIV Screen 4th Generation wRfx: NONREACTIVE

## 2021-07-22 MED ORDER — ACETAMINOPHEN 325 MG PO TABS: 650.0000 mg | ORAL_TABLET | Freq: Four times a day (QID) | ORAL | Status: DC | PRN

## 2021-07-22 MED ORDER — ACETAMINOPHEN 650 MG RE SUPP: 650.0000 mg | Freq: Four times a day (QID) | RECTAL | Status: DC | PRN

## 2021-07-22 MED ORDER — VITAMIN E 45 MG (100 UNIT) PO CAPS
400.0000 [IU] | ORAL_CAPSULE | Freq: Every day | ORAL | Status: DC
  Administered 2021-07-23: 400 [IU] via ORAL
  Filled 2021-07-22 (×2): qty 4

## 2021-07-22 MED ORDER — SODIUM CHLORIDE 0.9 % IV SOLN: INTRAVENOUS | Status: DC

## 2021-07-22 MED ORDER — PANTOPRAZOLE SODIUM 40 MG IV SOLR
40.0000 mg | Freq: Two times a day (BID) | INTRAVENOUS | Status: DC
  Administered 2021-07-22: 40 mg via INTRAVENOUS
  Filled 2021-07-22: qty 40

## 2021-07-22 MED ORDER — ONDANSETRON HCL 4 MG PO TABS: 4.0000 mg | ORAL_TABLET | Freq: Four times a day (QID) | ORAL | Status: DC | PRN

## 2021-07-22 MED ORDER — LEVOTHYROXINE SODIUM 137 MCG PO TABS
137.0000 ug | ORAL_TABLET | Freq: Every day | ORAL | Status: DC
  Filled 2021-07-22 (×2): qty 1

## 2021-07-22 MED ORDER — PANTOPRAZOLE SODIUM 40 MG PO TBEC
40.0000 mg | DELAYED_RELEASE_TABLET | Freq: Every day | ORAL | Status: DC
  Administered 2021-07-22 – 2021-07-23 (×2): 40 mg via ORAL
  Filled 2021-07-22 (×3): qty 1

## 2021-07-22 MED ORDER — ONDANSETRON HCL 4 MG/2ML IJ SOLN: 4.0000 mg | Freq: Four times a day (QID) | INTRAMUSCULAR | Status: DC | PRN

## 2021-07-22 MED ORDER — POLYVINYL ALCOHOL 1.4 % OP SOLN
1.0000 [drp] | OPHTHALMIC | Status: DC | PRN
  Filled 2021-07-22: qty 15

## 2021-07-22 MED ORDER — ATORVASTATIN CALCIUM 20 MG PO TABS
20.0000 mg | ORAL_TABLET | Freq: Every day | ORAL | Status: DC
  Filled 2021-07-22 (×2): qty 1

## 2021-07-22 MED ORDER — TRAZODONE HCL 50 MG PO TABS: 25.0000 mg | ORAL_TABLET | Freq: Every evening | ORAL | Status: DC | PRN

## 2021-07-22 MED ORDER — CLOBETASOL PROPIONATE 0.05 % EX CREA
1.0000 "application " | TOPICAL_CREAM | Freq: Two times a day (BID) | CUTANEOUS | Status: DC
  Filled 2021-07-22 (×2): qty 15

## 2021-07-22 MED ORDER — OMEGA-3-ACID ETHYL ESTERS 1 G PO CAPS
1.0000 g | ORAL_CAPSULE | Freq: Every day | ORAL | Status: DC
  Administered 2021-07-23: 1 g via ORAL
  Filled 2021-07-22 (×3): qty 1

## 2021-07-22 MED ORDER — DONEPEZIL HCL 5 MG PO TABS
10.0000 mg | ORAL_TABLET | Freq: Every day | ORAL | Status: DC
  Administered 2021-07-22 (×2): 10 mg via ORAL
  Filled 2021-07-22 (×2): qty 2

## 2021-07-22 MED ORDER — PEG 3350-KCL-NA BICARB-NACL 420 G PO SOLR
4000.0000 mL | Freq: Once | ORAL | Status: AC
  Administered 2021-07-22: 4000 mL via ORAL
  Filled 2021-07-22: qty 4000

## 2021-07-22 MED ORDER — ADULT MULTIVITAMIN W/MINERALS CH
1.0000 | ORAL_TABLET | Freq: Every evening | ORAL | Status: DC
  Administered 2021-07-22: 1 via ORAL
  Filled 2021-07-22: qty 1

## 2021-07-22 MED ORDER — VITAMIN B-12 1000 MCG PO TABS
1000.0000 ug | ORAL_TABLET | Freq: Every day | ORAL | Status: DC
  Administered 2021-07-23: 1000 ug via ORAL
  Filled 2021-07-22 (×3): qty 1

## 2021-07-22 MED ORDER — SAW PALMETTO (SERENOA REPENS) 450 MG PO CAPS: 1350.0000 mg | ORAL_CAPSULE | Freq: Two times a day (BID) | ORAL | Status: DC

## 2021-07-22 NOTE — Progress Notes (Signed)
PROGRESS NOTE    Dylan Chen  UXN:[id number] DOB: 07/27/1956 DOA: 07/22/2021 PCP: [place] Pink, MD  Outpatient Specialists: endocrinology    Brief Narrative:   From admission h and p Dylan Chen is a 65 y.o. male with medical history significant for hypertension, dyslipidemia, hypothyroidism, obstructive sleep apnea on home CPAP and dementia, presented to the emergency room with a [place] of intermittent bright red bleeding per rectum over the last couple of days.  He has seen occasional blood clots.  He denies any abdominal pain or cramps.  No fever or chills.  No nausea or vomiting or melena.  No chest pain or palpitations.  No cough or wheezing or hemoptysis.  No other bleeding diathesis.   Assessment & Plan:   Active Problems:   GI bleeding  # Hematochezia Several days large volume hematochezia. No pain, no anemia symptoms. 2019 colonoscopy with 1 polyp that was removed, also internal hemorrhoids and diverticulosis. Patient on daily asa and aleve. Hemodynamically stable - GI consulted - npo for now - monitor hgb - hold asa, aleve - cont ivf @ 100  # Postsurgical hypothyroidism - cont levothyroxine  # MCI - home aricept  # OSA - cpap qhs  # Hyperglycemia Mild - f/u a1c   DVT prophylaxis: SCDs Code Status: full Family Communication: wife updated @ bedside 11/9  Level of care: Telemetry Medical Status is: Inpatient  Remains inpatient appropriate because: severity of illness    Consultants:  GI  Procedures: none  Antimicrobials:  none    Subjective: This morning feels well. No hematochezia this morning but did have some yesterday. No n/v. No abd pain.  Objective: Vitals:   07/22/21 0130 07/22/21 0200 07/22/21 0642 07/22/21 0800  BP: 116/69 107/67 111/73 125/75  Pulse: (!) 54 (!) 57 86 (!) 56  Resp: 18 18 12    Temp:      TempSrc:      SpO2: 97% 96% 97% 96%  Weight:      Height:       No intake or output data in the 24 hours  ending 07/22/21 0901 Filed Weights   07/21/21 1943  Weight: 77.1 kg    Examination:  General exam: Appears calm and comfortable  Respiratory system: Clear to auscultation. Respiratory effort normal. Cardiovascular system: S1 & S2 heard, RRR. No JVD, murmurs, rubs, gallops or clicks. No pedal edema. Gastrointestinal system: Abdomen is nondistended, soft and nontender. No organomegaly or masses felt. Normal bowel sounds heard. Central nervous system: Alert and oriented. No focal neurological deficits. Extremities: Symmetric 5 x 5 power. Skin: No rashes, lesions or ulcers Psychiatry: Judgement and insight appear normal. Mood & affect appropriate.     Data Reviewed: I have personally reviewed following labs and imaging studies  CBC: Recent Labs  Lab 07/21/21 1945 07/22/21 0040 07/22/21 0324  WBC 5.1 6.7 5.0  NEUTROABS  --  4.2  --   HGB 14.5 14.5 13.6  HCT 41.3 41.2 38.8*  MCV 94.9 95.8 96.0  PLT 228 240 254   Basic Metabolic Panel: Recent Labs  Lab 07/21/21 1945 07/22/21 0324  NA 139 139  K 3.7 3.9  CL 103 105  CO2 27 27  GLUCOSE 173* 104*  BUN 17 19  CREATININE 1.14 0.95  CALCIUM 9.2 8.8*   GFR: Estimated Creatinine Clearance: 72.3 mL/min (by C-G formula based on SCr of 0.95 mg/dL). Liver Function Tests: Recent Labs  Lab 07/21/21 1945  AST 31  ALT 25  ALKPHOS 84  BILITOT 1.8*  PROT 6.6  ALBUMIN 4.3   No results for input(s): LIPASE, AMYLASE in the last 168 hours. No results for input(s): AMMONIA in the last 168 hours. Coagulation Profile: Recent Labs  Lab 07/21/21 1945  INR 1.0   Cardiac Enzymes: No results for input(s): CKTOTAL, CKMB, CKMBINDEX, TROPONINI in the last 168 hours. BNP (last 3 results) No results for input(s): PROBNP in the last 8760 hours. HbA1C: No results for input(s): HGBA1C in the last 72 hours. CBG: No results for input(s): GLUCAP in the last 168 hours. Lipid Profile: No results for input(s): CHOL, HDL, LDLCALC, TRIG,  CHOLHDL, LDLDIRECT in the last 72 hours. Thyroid Function Tests: No results for input(s): TSH, T4TOTAL, FREET4, T3FREE, THYROIDAB in the last 72 hours. Anemia Panel: No results for input(s): VITAMINB12, FOLATE, FERRITIN, TIBC, IRON, RETICCTPCT in the last 72 hours. Urine analysis: No results found for: COLORURINE, APPEARANCEUR, LABSPEC, PHURINE, GLUCOSEU, HGBUR, BILIRUBINUR, KETONESUR, PROTEINUR, UROBILINOGEN, NITRITE, LEUKOCYTESUR Sepsis Labs: @LABRCNTIP (procalcitonin:4,lacticidven:4)  ) Recent Results (from the past 240 hour(s))  Resp Panel by RT-PCR (Flu A&B, Covid) Nasopharyngeal Swab     Status: None   Collection Time: 07/22/21 12:50 AM   Specimen: Nasopharyngeal Swab; Nasopharyngeal(NP) swabs in vial transport medium  Result Value Ref Range Status   SARS Coronavirus 2 by RT PCR NEGATIVE NEGATIVE Final    Comment: (NOTE) SARS-CoV-2 target nucleic acids are NOT DETECTED.  The SARS-CoV-2 RNA is generally detectable in upper respiratory specimens during the acute phase of infection. The lowest concentration of SARS-CoV-2 viral copies this assay can detect is 138 copies/mL. A negative result does not preclude SARS-Cov-2 infection and should not be used as the sole basis for treatment or other patient management decisions. A negative result may occur with  improper specimen collection/handling, submission of specimen other than nasopharyngeal swab, presence of viral mutation(s) within the areas targeted by this assay, and inadequate number of viral copies(<138 copies/mL). A negative result must be combined with clinical observations, patient history, and epidemiological information. The expected result is Negative.  Fact Sheet for Patients:  EntrepreneurPulse.com.au  Fact Sheet for Healthcare Providers:  IncredibleEmployment.be  This test is no t yet approved or cleared by the [place] FDA and  has been authorized for detection and/or  diagnosis of SARS-CoV-2 by FDA under an Emergency Use Authorization (EUA). This EUA will remain  in effect (meaning this test can be used) for the duration of the COVID-19 declaration under Section 564(b)(1) of the Act, 21 U.S.C.section 360bbb-3(b)(1), unless the authorization is terminated  or revoked sooner.       Influenza A by PCR NEGATIVE NEGATIVE Final   Influenza B by PCR NEGATIVE NEGATIVE Final    Comment: (NOTE) The Xpert Xpress SARS-CoV-2/FLU/RSV plus assay is intended as an aid in the diagnosis of influenza from Nasopharyngeal swab specimens and should not be used as a sole basis for treatment. Nasal washings and aspirates are unacceptable for Xpert Xpress SARS-CoV-2/FLU/RSV testing.  Fact Sheet for Patients: EntrepreneurPulse.com.au  Fact Sheet for Healthcare Providers: IncredibleEmployment.be  This test is not yet approved or cleared by the [place] FDA and has been authorized for detection and/or diagnosis of SARS-CoV-2 by FDA under an Emergency Use Authorization (EUA). This EUA will remain in effect (meaning this test can be used) for the duration of the COVID-19 declaration under Section 564(b)(1) of the Act, 21 U.S.C. section 360bbb-3(b)(1), unless the authorization is terminated or revoked.  Performed at [facility], [address] [place], [place] [address]  Radiology Studies: No results found.      Scheduled Meds:  atorvastatin  20 mg Oral Daily   clobetasol cream  1 application Topical BID   donepezil  10 mg Oral QHS   levothyroxine  137 mcg Oral QAC breakfast   multivitamin with minerals  1 tablet Oral QPM   omega-3 acid ethyl esters  1 g Oral Daily   pantoprazole (PROTONIX) IV  40 mg Intravenous Q12H   vitamin B-12  1,000 mcg Oral Daily   vitamin E  400 Units Oral Daily   Continuous Infusions:  sodium chloride 100 mL/hr at 07/22/21 0422     LOS: 0 days    Time spent: 48  min    Desma Maxim, MD [facility]   If 7PM-7AM, please contact night-coverage [email or web address] Password [email or web address] 07/22/2021, 9:01 AM

## 2021-07-22 NOTE — TOC Progression Note (Signed)
Transition of Care [facility]) - Progression Note    Patient Details  Name: Dylan Chen MRN: [id number] Date of Birth: 12/01/1955  Transition of Care [facility]) CM/SW [place], RN Phone Number: 07/22/2021, 4:35 PM  Clinical Narrative:    Spoke with the patient and his wife in the room, completed the code 35, he is independent at home, [facility] to follow for needs        Expected Discharge Plan and Services                                                 Social Determinants of Health (SDOH) Interventions    Readmission Risk Interventions No flowsheet data found.

## 2021-07-22 NOTE — Care Management CC44 (Signed)
Condition Code 44 Documentation Completed  Patient Details  Name: Dylan Chen MRN: [id number] Date of Birth: [birth year]   Condition Code 44 given:  Yes Patient signature on Condition Code 44 notice:  Yes Documentation of 2 MD's agreement:  Yes Code 44 added to claim:  Yes    Conception Oms, RN 07/22/2021, 4:34 PM

## 2021-07-22 NOTE — ED Provider Notes (Signed)
[facility] Emergency Department Provider Note ____________________________________________   Event Date/Time   First MD Initiated Contact with Patient 07/22/21 0008     (approximate)  I have reviewed the triage vital signs and the nursing notes.   HISTORY  Chief Complaint Rectal Bleeding    HPI Dylan Chen is a 64 y.o. male with PMH as noted below but no prior GI bleed history who presents with bright red blood per rectum over the last 2 days intermittently, sometimes occurring between bowel movements.  The patient denies any other abnormal bleeding or bruising.  He is not on any blood thinners.  He denies any vomiting or abdominal pain.  He has no dizziness or lightheadedness.  Past Medical History:  Diagnosis Date   Arthritis    RIGHT HAND   Cancer ([place])    THYROID   Dementia ([place])    GERD (gastroesophageal reflux disease)    History of hiatal hernia    Hyperlipemia    Hypertension    Hypothyroidism    Peptic ulcer    Sleep apnea    CPAP    Patient Active Problem List   Diagnosis Date Noted   GI bleeding 07/22/2021    Past Surgical History:  Procedure Laterality Date   COLONOSCOPY WITH PROPOFOL N/A 12/19/2017   Procedure: COLONOSCOPY WITH PROPOFOL;  Surgeon: Manya Silvas, MD;  Location: [facility] ENDOSCOPY;  Service: Endoscopy;  Laterality: N/A;   HIATAL HERNIA REPAIR     INGUINAL HERNIA REPAIR Left 06/16/2017   Procedure: HERNIA REPAIR INGUINAL ADULT;  Surgeon: Leonie Green, MD;  Location: [facility] ORS;  Service: General;  Laterality: Left;   JOINT REPLACEMENT Left    THR   ROTATOR CUFF REPAIR Right    SHOULDER ARTHROSCOPY WITH SUBACROMIAL DECOMPRESSION, ROTATOR CUFF REPAIR AND BICEP TENDON REPAIR Left 06/25/2021   Procedure: SHOULDER ARTHROSCOPY WITH DEBRIDEMENT, DECOMPRESSION, AND ROTATOR CUFF REPAIR.;  Surgeon: Corky Mull, MD;  Location: [facility] ORS;  Service: Orthopedics;  Laterality: Left;   THYROIDECTOMY     XI ROBOTIC  ASSISTED INGUINAL HERNIA REPAIR WITH MESH Right 12/29/2020   Procedure: XI ROBOTIC ASSISTED INGUINAL HERNIA REPAIR WITH MESH;  Surgeon: Herbert Pun, MD;  Location: [facility] ORS;  Service: General;  Laterality: Right;    Prior to Admission medications   Medication Sig Start Date End Date Taking? Authorizing Provider  Ascorbic Acid (VITAMIN C) 1000 MG tablet Take 1,000 mg by mouth daily.     [provider]  aspirin EC 81 MG tablet Take 81 mg by mouth at bedtime.    [provider]  atorvastatin (LIPITOR) 20 MG tablet Take 20 mg by mouth daily.    [provider]  Calcium Carb-Cholecalciferol (CALCIUM 600 + D PO) Take 2 tablets by mouth every evening.     [provider]  cholecalciferol (VITAMIN D3) 25 MCG (1000 UNIT) tablet Take 1,000 Units by mouth at bedtime.    [provider]  clobetasol cream (TEMOVATE) 5.73 % Apply 1 application topically 2 (two) times daily. Rash on arm 06/02/21   [provider]  donepezil (ARICEPT) 10 MG tablet Take 10 mg by mouth at bedtime.    [provider]  levothyroxine (SYNTHROID) 137 MCG tablet Take 137 mcg by mouth daily before breakfast. 04/30/17   [provider]  Multiple Vitamin (MULTIVITAMIN WITH MINERALS) TABS tablet Take 1 tablet by mouth every evening.    [provider]  naproxen sodium (ALEVE) 220 MG tablet Take 440  mg by mouth in the morning and at bedtime.    [provider]  Omega-3 Fatty Acids (FISH OIL) 1000 MG CPDR Take 1,000 mg by mouth at bedtime.    [provider]  oxyCODONE (ROXICODONE) 5 MG immediate release tablet Take 1-2 tablets (5-10 mg total) by mouth every 4 (four) hours as needed for severe pain or moderate pain. 06/25/21   Poggi, Marshall Cork, MD  pantoprazole (PROTONIX) 40 MG tablet Take 40 mg by mouth daily.    [provider]  polyvinyl alcohol (LIQUIFILM TEARS) 1.4 % ophthalmic solution Place 1 drop into both eyes every 4  (four) hours as needed for dry eyes.    [provider]  Saw Palmetto, Serenoa repens, 450 MG CAPS Take 1,350 mg by mouth in the morning and at bedtime.    [provider]  vitamin B-12 (CYANOCOBALAMIN) 1000 MCG tablet Take 1,000 mcg by mouth daily.    [provider]  vitamin E 180 MG (400 UNITS) capsule Take 400 Units by mouth daily.    [provider]    Allergies Codeine  History reviewed. No pertinent family history.  Social History Social History   Tobacco Use   Smoking status: Former    Packs/day: 2.00    Years: 40.00    Pack years: 80.00    Types: Cigarettes, Cigars    Quit date: 06/10/1997    Years since quitting: 24.1   Smokeless tobacco: Former  Scientific laboratory technician Use: Never used  Substance Use Topics   Alcohol use: No   Drug use: No    Review of Systems  Constitutional: No fever/chills Eyes: No visual changes. ENT: No sore throat. Cardiovascular: Denies chest pain. Respiratory: Denies shortness of breath. Gastrointestinal: No vomiting or diarrhea.  Genitourinary: Negative for heme to area. Musculoskeletal: Negative for back pain. Skin: Negative for rash. Neurological: Negative for headache.   ____________________________________________   PHYSICAL EXAM:  VITAL SIGNS: ED Triage Vitals  Enc Vitals Group     BP 07/21/21 1943 121/80     Pulse Rate 07/21/21 1943 67     Resp 07/21/21 1943 18     Temp 07/21/21 1943 98.5 F (36.9 C)     Temp Source 07/21/21 1943 Oral     SpO2 07/21/21 1943 95 %     Weight 07/21/21 1943 170 lb (77.1 kg)     Height 07/21/21 1943 5\' 5"  (1.651 m)     Head Circumference --      Peak Flow --      Pain Score 07/21/21 1948 0     Pain Loc --      Pain Edu? --      Excl. in [place]? --     Constitutional: Alert and oriented. Well appearing and in no acute distress. Eyes: Conjunctivae are normal.  Head: Atraumatic. Nose: No congestion/rhinnorhea. Mouth/Throat: Mucous membranes are moist.    Neck: Normal range of motion.  Cardiovascular: Normal rate, regular rhythm. Good peripheral circulation. Respiratory: Normal respiratory effort.  No retractions.  Gastrointestinal: Soft and nontender. No distention.  No active bleeding on DRE, but evidence of fresh, bright red blood. Genitourinary: No flank tenderness. Musculoskeletal: Extremities warm and well perfused.  Neurologic:  Normal speech and language. No gross focal neurologic deficits are appreciated.  Skin:  Skin is warm and dry. No rash noted. Psychiatric: Mood and affect are normal. Speech and behavior are normal.  ____________________________________________   LABS (all labs ordered are listed, but only  abnormal results are displayed)  Labs Reviewed  COMPREHENSIVE METABOLIC PANEL - Abnormal; Notable for the following components:      Result Value   Glucose, Bld 173 (*)    Total Bilirubin 1.8 (*)    All other components within normal limits  CBC  PROTIME-INR  CBC WITH DIFFERENTIAL/PLATELET  POC OCCULT BLOOD, ED  TYPE AND SCREEN   ____________________________________________  EKG   ____________________________________________  RADIOLOGY    ____________________________________________   PROCEDURES  Procedure(s) performed: No  Procedures  Critical Care performed: No ____________________________________________   INITIAL IMPRESSION / ASSESSMENT AND PLAN / ED COURSE  Pertinent labs & imaging results that were available during my care of the patient were reviewed by me and considered in my medical decision making (see chart for details).   65 year old male with PMH as noted above but no prior GI bleed history presents with bright red blood per rectum over the last 2 days.  On exam the patient is overall well-appearing and his vital signs are normal.  There is evidence of recent bleeding on DRE and a large amount of bright red blood staining his underwear.  Exam is otherwise unremarkable.  Initial  labs show a normal hemoglobin.  Given the amount of bleeding I feel that the patient is appropriate for admission for serial hemoglobins and GI evaluation.  He does not require emergent transfusion or other emergent intervention.  I consulted Dr. Vicente Males from GI.  I then consulted Dr. Sidney Ace from the hospitalist service for admission.   ____________________________________________   FINAL CLINICAL IMPRESSION(S) / ED DIAGNOSES  Final diagnoses:  Lower GI bleed      NEW MEDICATIONS STARTED DURING THIS VISIT:  New Prescriptions   No medications on file     Note:  This document was prepared using Dragon voice recognition software and may include unintentional dictation errors.    Arta Silence, MD 07/22/21 [phone]

## 2021-07-22 NOTE — Progress Notes (Signed)
PHARMACIST - PHYSICIAN ORDER COMMUNICATION  CONCERNING: P&T Medication Policy on Herbal Medications  DESCRIPTION:  This patient's order for:  Saw Palmetto  has been noted.  This product(s) is classified as an "herbal" or natural product. Due to a lack of definitive safety studies or FDA approval, nonstandard manufacturing practices, plus the potential risk of unknown drug-drug interactions while on inpatient medications, the Pharmacy and Therapeutics Committee does not permit the use of "herbal" or natural products of this type within [place].   ACTION TAKEN: The pharmacy department is unable to verify this order at this time and your patient has been informed of this safety policy. Please reevaluate patient's clinical condition at discharge and address if the herbal or natural product(s) should be resumed at that time.   

## 2021-07-22 NOTE — Consult Note (Signed)
Dylan Chen , MD [address] [place], [place], [place], [address], [place], [place], [place], [address] Phone: [phone]  Fax: [phone]  Consultation  Referring Provider:     Er Primary Care Physician:  Maryland Pink, MD Primary Gastroenterologist:  None         Reason for Consultation:     Rectal bleeding  Date of Admission:  07/22/2021 Date of Consultation:  07/22/2021         HPI:   Dylan Chen is a 65 y.o. male presented to the emergency room with bright red blood per rectum over the last few days.  Last episode was yesterday no bowel movements today.  The episodes were painless.  Filled the toilet bowl with bright red blood.  No similar episode in the past.  No use of NSAIDs.  No other blood thinners use.  No complaints presently.   12/19/2017: Colonoscopy performed by Dr. Vira Agar: Diverticulosis in the sigmoid colon, internal hemorrhoids, diminutive polyp in the descending colon removed with biopsy forceps.  It was a hyperplastic polyp on admission hemoglobin is 14.5 g and this morning is 13.6 g  Past Medical History:  Diagnosis Date   Arthritis    RIGHT HAND   Cancer ([place])    THYROID   Dementia ([place])    GERD (gastroesophageal reflux disease)    History of hiatal hernia    Hyperlipemia    Hypertension    Hypothyroidism    Peptic ulcer    Sleep apnea    CPAP    Past Surgical History:  Procedure Laterality Date   COLONOSCOPY WITH PROPOFOL N/A 12/19/2017   Procedure: COLONOSCOPY WITH PROPOFOL;  Surgeon: Manya Silvas, MD;  Location: [facility] ENDOSCOPY;  Service: Endoscopy;  Laterality: N/A;   HIATAL HERNIA REPAIR     INGUINAL HERNIA REPAIR Left 06/16/2017   Procedure: HERNIA REPAIR INGUINAL ADULT;  Surgeon: Leonie Green, MD;  Location: [facility] ORS;  Service: General;  Laterality: Left;   JOINT REPLACEMENT Left    THR   ROTATOR CUFF REPAIR Right    SHOULDER ARTHROSCOPY WITH SUBACROMIAL DECOMPRESSION, ROTATOR CUFF REPAIR AND BICEP  TENDON REPAIR Left 06/25/2021   Procedure: SHOULDER ARTHROSCOPY WITH DEBRIDEMENT, DECOMPRESSION, AND ROTATOR CUFF REPAIR.;  Surgeon: Corky Mull, MD;  Location: [facility] ORS;  Service: Orthopedics;  Laterality: Left;   THYROIDECTOMY     XI ROBOTIC ASSISTED INGUINAL HERNIA REPAIR WITH MESH Right 12/29/2020   Procedure: XI ROBOTIC ASSISTED INGUINAL HERNIA REPAIR WITH MESH;  Surgeon: Herbert Pun, MD;  Location: [facility] ORS;  Service: General;  Laterality: Right;    Prior to Admission medications   Medication Sig Start Date End Date Taking? Authorizing Provider  Ascorbic Acid (VITAMIN C) 1000 MG tablet Take 1,000 mg by mouth daily.    Yes [provider]  aspirin EC 81 MG tablet Take 81 mg by mouth at bedtime.   Yes [provider]  atorvastatin (LIPITOR) 20 MG tablet Take 20 mg by mouth daily.   Yes [provider]  Calcium Carb-Cholecalciferol (CALCIUM 600 + D PO) Take 2 tablets by mouth every evening.    Yes [provider]  cholecalciferol (VITAMIN D3) 25 MCG (1000 UNIT) tablet Take 1,000 Units by mouth at bedtime.   Yes [provider]  clobetasol cream (TEMOVATE) 9.37 % Apply 1 application topically 2 (two) times daily. Rash on arm 06/02/21  Yes [provider]  donepezil (ARICEPT) 10 MG tablet Take 10 mg by  mouth at bedtime.   Yes [provider]  levothyroxine (SYNTHROID) 137 MCG tablet Take 137 mcg by mouth daily before breakfast. 04/30/17  Yes [provider]  Multiple Vitamin (MULTIVITAMIN WITH MINERALS) TABS tablet Take 1 tablet by mouth every evening.   Yes [provider]  naproxen sodium (ALEVE) 220 MG tablet Take 440 mg by mouth in the morning and at bedtime.   Yes [provider]  Omega-3 Fatty Acids (FISH OIL) 1000 MG CPDR Take 1,000 mg by mouth at bedtime.   Yes [provider]  pantoprazole (PROTONIX) 40 MG tablet Take 40 mg by mouth daily.   Yes [provider]   polyvinyl alcohol (LIQUIFILM TEARS) 1.4 % ophthalmic solution Place 1 drop into both eyes every 4 (four) hours as needed for dry eyes.   Yes [provider]  Saw Palmetto, Serenoa repens, 450 MG CAPS Take 1,350 mg by mouth in the morning and at bedtime.   Yes [provider]  vitamin B-12 (CYANOCOBALAMIN) 1000 MCG tablet Take 1,000 mcg by mouth daily.   Yes [provider]  vitamin E 180 MG (400 UNITS) capsule Take 400 Units by mouth daily.   Yes [provider]  oxyCODONE (ROXICODONE) 5 MG immediate release tablet Take 1-2 tablets (5-10 mg total) by mouth every 4 (four) hours as needed for severe pain or moderate pain. Patient not taking: Reported on 07/22/2021 06/25/21   Poggi, Marshall Cork, MD    History reviewed. No pertinent family history.   Social History   Tobacco Use   Smoking status: Former    Packs/day: 2.00    Years: 40.00    Pack years: 80.00    Types: Cigarettes, Cigars    Quit date: 06/10/1997    Years since quitting: 24.1   Smokeless tobacco: Former  Scientific laboratory technician Use: Never used  Substance Use Topics   Alcohol use: No   Drug use: No    Allergies as of 07/21/2021 - Review Complete 07/21/2021  Allergen Reaction Noted   Codeine Other (See Comments) 02/13/2015    Review of Systems:    All systems reviewed and negative except where noted in HPI.   Physical Exam:  Vital signs in last 24 hours: Temp:  [98.5 F (36.9 C)] 98.5 F (36.9 C) (11/08 1943) Pulse Rate:  [51-86] 56 (11/09 0800) Resp:  [12-18] 12 (11/09 0642) BP: (107-125)/(67-80) 125/75 (11/09 0800) SpO2:  [95 %-97 %] 96 % (11/09 0800) Weight:  [77.1 kg] 77.1 kg (11/08 1943)   General:   Pleasant, cooperative in NAD Head:  Normocephalic and atraumatic. Eyes:   No icterus.   Conjunctiva pink. PERRLA. Ears:  Normal auditory acuity. Neck:  Supple; no masses or thyroidomegaly Lungs: Respirations even and unlabored. Lungs clear to auscultation bilaterally.   No  wheezes, crackles, or rhonchi.  Heart:  Regular rate and rhythm;  Without murmur, clicks, rubs or gallops Abdomen:  Soft, nondistended, nontender. Normal bowel sounds. No appreciable masses or hepatomegaly.  No rebound or guarding.  Neurologic:  Alert and oriented x3;  grossly normal neurologically. Skin:  Intact without significant lesions or rashes. Cervical Nodes:  No significant cervical adenopathy. Psych:  Alert and cooperative. Normal affect.  LAB RESULTS: Recent Labs    07/21/21 1945 07/22/21 0040 07/22/21 0324  WBC 5.1 6.7 5.0  HGB 14.5 14.5 13.6  HCT 41.3 41.2 38.8*  PLT 228 240 207   BMET Recent Labs    07/21/21 1945 07/22/21 0324  NA 139 139  K 3.7 3.9  CL 103 105  CO2 27 27  GLUCOSE 173* 104*  BUN 17 19  CREATININE 1.14 0.95  CALCIUM 9.2 8.8*   LFT Recent Labs    07/21/21 1945  PROT 6.6  ALBUMIN 4.3  AST 31  ALT 25  ALKPHOS 84  BILITOT 1.8*   PT/INR Recent Labs    07/21/21 1945  LABPROT 13.1  INR 1.0    STUDIES: No results found.    Impression / Plan:   Dylan Chen is a 64 y.o. y/o male with presents emergency room with short duration of rectal bleeding hemoglobin is in the normal range.  Colonoscopy in 2019 by Dr. Vira Agar showed sigmoid colon diverticulosis.  History and presentation very suggestive for diverticular bleed.  Since her colonoscopy was over 3 years back would recommend to repeat the colonoscopy as inpatient.  Plan 1.  Monitor CBC and transfuse as needed 2.  Colonoscopy tomorrow. 3.  Does not require stool occult blood to be tested as the patient as he has  clearly given a history of rectal bleeding and even if the stool test were to be  negative it would not change what we are going to do tomorrow ie a colonoscopy  I have discussed alternative options, risks & benefits,  which include, but are not limited to, bleeding, infection, perforation,respiratory complication & drug reaction.  The patient agrees with this plan &  written consent will be obtained.     Thank you for involving me in the care of this patient.      LOS: 0 days   Dylan Bellows, MD  07/22/2021, 9:12 AM

## 2021-07-22 NOTE — H&P (Addendum)
[place]   PATIENT NAME: Dylan Chen    MR#:  [id number]  DATE OF BIRTH:  Aug 03, 1956  DATE OF ADMISSION:  07/22/2021  PRIMARY CARE PHYSICIAN: Maryland Pink, MD   Patient is coming from: Home  REQUESTING/REFERRING PHYSICIAN: Arta Silence, MD  CHIEF COMPLAINT:   Chief Complaint  Patient presents with   Rectal Bleeding    HISTORY OF PRESENT ILLNESS:  Dylan Chen is a 65 y.o. male with medical history significant for hypertension, dyslipidemia, hypothyroidism, obstructive sleep apnea on home CPAP and dementia, presented to the emergency room with a [place] of intermittent bright red bleeding per rectum over the last couple of days.  He has seen occasional blood clots.  He denies any abdominal pain or cramps.  No fever or chills.  No nausea or vomiting or melena.  No chest pain or palpitations.  No cough or wheezing or hemoptysis.  No other bleeding diathesis.  ED Course: When he came to the ER vital signs were within normal.  Labs revealed blood glucose of 173 and total bili 1.8 with otherwise unremarkable CMP.  CBC was within normal.  INR is 1 and PT 13.1.  Influenza antigens and COVID-19 PCR came back negative.  He will be admitted to a medical telemetry bed for further evaluation and management. She will PAST MEDICAL HISTORY:   Past Medical History:  Diagnosis Date   Arthritis    RIGHT HAND   Cancer ([place])    THYROID   Dementia ([place])    GERD (gastroesophageal reflux disease)    History of hiatal hernia    Hyperlipemia    Hypertension    Hypothyroidism    Peptic ulcer    Sleep apnea    CPAP    PAST SURGICAL HISTORY:   Past Surgical History:  Procedure Laterality Date   COLONOSCOPY WITH PROPOFOL N/A 12/19/2017   Procedure: COLONOSCOPY WITH PROPOFOL;  Surgeon: Manya Silvas, MD;  Location: [facility] ENDOSCOPY;  Service: Endoscopy;  Laterality: N/A;   HIATAL HERNIA REPAIR     INGUINAL HERNIA REPAIR Left 06/16/2017   Procedure: HERNIA REPAIR  INGUINAL ADULT;  Surgeon: Leonie Green, MD;  Location: [facility] ORS;  Service: General;  Laterality: Left;   JOINT REPLACEMENT Left    THR   ROTATOR CUFF REPAIR Right    SHOULDER ARTHROSCOPY WITH SUBACROMIAL DECOMPRESSION, ROTATOR CUFF REPAIR AND BICEP TENDON REPAIR Left 06/25/2021   Procedure: SHOULDER ARTHROSCOPY WITH DEBRIDEMENT, DECOMPRESSION, AND ROTATOR CUFF REPAIR.;  Surgeon: Corky Mull, MD;  Location: [facility] ORS;  Service: Orthopedics;  Laterality: Left;   THYROIDECTOMY     XI ROBOTIC ASSISTED INGUINAL HERNIA REPAIR WITH MESH Right 12/29/2020   Procedure: XI ROBOTIC ASSISTED INGUINAL HERNIA REPAIR WITH MESH;  Surgeon: Herbert Pun, MD;  Location: [facility] ORS;  Service: General;  Laterality: Right;    SOCIAL HISTORY:   Social History   Tobacco Use   Smoking status: Former    Packs/day: 2.00    Years: 40.00    Pack years: 80.00    Types: Cigarettes, Cigars    Quit date: 06/10/1997    Years since quitting: 24.1   Smokeless tobacco: Former  Substance Use Topics   Alcohol use: No    FAMILY HISTORY:  Positive for pancreatic cancer in his mother. DRUG ALLERGIES:   Allergies  Allergen Reactions   Codeine Other (See Comments)    Makes feel weird--"outer body experience"    REVIEW OF SYSTEMS:   ROS As per history  of present illness. All pertinent systems were reviewed above. Constitutional, HEENT, cardiovascular, respiratory, GI, GU, musculoskeletal, neuro, psychiatric, endocrine, integumentary and hematologic systems were reviewed and are otherwise negative/unremarkable except for positive findings mentioned above in the HPI.   MEDICATIONS AT HOME:   Prior to Admission medications   Medication Sig Start Date End Date Taking? Authorizing Provider  Ascorbic Acid (VITAMIN C) 1000 MG tablet Take 1,000 mg by mouth daily.    Yes [provider]  aspirin EC 81 MG tablet Take 81 mg by mouth at bedtime.   Yes [provider]  atorvastatin (LIPITOR)  20 MG tablet Take 20 mg by mouth daily.   Yes [provider]  Calcium Carb-Cholecalciferol (CALCIUM 600 + D PO) Take 2 tablets by mouth every evening.    Yes [provider]  cholecalciferol (VITAMIN D3) 25 MCG (1000 UNIT) tablet Take 1,000 Units by mouth at bedtime.   Yes [provider]  clobetasol cream (TEMOVATE) 1.02 % Apply 1 application topically 2 (two) times daily. Rash on arm 06/02/21  Yes [provider]  donepezil (ARICEPT) 10 MG tablet Take 10 mg by mouth at bedtime.   Yes [provider]  levothyroxine (SYNTHROID) 137 MCG tablet Take 137 mcg by mouth daily before breakfast. 04/30/17  Yes [provider]  Multiple Vitamin (MULTIVITAMIN WITH MINERALS) TABS tablet Take 1 tablet by mouth every evening.   Yes [provider]  naproxen sodium (ALEVE) 220 MG tablet Take 440 mg by mouth in the morning and at bedtime.   Yes [provider]  Omega-3 Fatty Acids (FISH OIL) 1000 MG CPDR Take 1,000 mg by mouth at bedtime.   Yes [provider]  pantoprazole (PROTONIX) 40 MG tablet Take 40 mg by mouth daily.   Yes [provider]  polyvinyl alcohol (LIQUIFILM TEARS) 1.4 % ophthalmic solution Place 1 drop into both eyes every 4 (four) hours as needed for dry eyes.   Yes [provider]  Saw Palmetto, Serenoa repens, 450 MG CAPS Take 1,350 mg by mouth in the morning and at bedtime.   Yes [provider]  vitamin B-12 (CYANOCOBALAMIN) 1000 MCG tablet Take 1,000 mcg by mouth daily.   Yes [provider]  vitamin E 180 MG (400 UNITS) capsule Take 400 Units by mouth daily.   Yes [provider]  oxyCODONE (ROXICODONE) 5 MG immediate release tablet Take 1-2 tablets (5-10 mg total) by mouth every 4 (four) hours as needed for severe pain or moderate pain. Patient not taking: Reported on 07/22/2021 06/25/21   Poggi, Marshall Cork, MD      VITAL SIGNS:  Blood pressure 116/69, pulse (!) 54,  temperature 98.5 F (36.9 C), temperature source Oral, resp. rate 18, height 5\' 5"  (1.651 m), weight 77.1 kg, SpO2 97 %.  PHYSICAL EXAMINATION:  Physical Exam  GENERAL:  65 y.o.-year-old Caucasian male patient lying in the bed with no acute distress.  EYES: Pupils equal, round, reactive to light and accommodation. No scleral icterus. Extraocular muscles intact.  HEENT: Head atraumatic, normocephalic. Oropharynx and nasopharynx clear.  NECK:  Supple, no jugular venous distention. No thyroid enlargement, no tenderness.  LUNGS: Normal breath sounds bilaterally, no wheezing, rales,rhonchi or crepitation. No use of accessory muscles of respiration.  CARDIOVASCULAR: Regular rate and rhythm, S1, S2 normal. No murmurs, rubs, or gallops.  ABDOMEN: Soft, nondistended, nontender. Bowel sounds present. No organomegaly or mass.  EXTREMITIES: No pedal edema, cyanosis, or clubbing.  NEUROLOGIC: Cranial nerves II through  XII are intact. Muscle strength 5/5 in all extremities. Sensation intact. Gait not checked.  PSYCHIATRIC: The patient is alert and oriented x 3.  Normal affect and good eye contact. SKIN: No obvious rash, lesion, or ulcer.   LABORATORY PANEL:   CBC Recent Labs  Lab 07/22/21 0040  WBC 6.7  HGB 14.5  HCT 41.2  PLT 240   ------------------------------------------------------------------------------------------------------------------  Chemistries  Recent Labs  Lab 07/21/21 1945  NA 139  K 3.7  CL 103  CO2 27  GLUCOSE 173*  BUN 17  CREATININE 1.14  CALCIUM 9.2  AST 31  ALT 25  ALKPHOS 84  BILITOT 1.8*   ------------------------------------------------------------------------------------------------------------------  Cardiac Enzymes No results for input(s): TROPONINI in the last 168 hours. ------------------------------------------------------------------------------------------------------------------  RADIOLOGY:  No results found.    IMPRESSION AND PLAN:   Active Problems:   GI bleeding  1.  GI bleeding likely of lower GI etiology with bright red bleeding per rectum. - The patient will be admitted to a telemetry medical bed. - We will follow serial hemoglobins and hematocrits. - The patient will be hydrated with IV normal saline. - She was typed and crossmatched.  H&H are currently stable and within normal. GI consultation will be obtained. - I notified Dr. Vicente Males about the patient.  2.  Dyslipidemia. - Statin therapy will be resumed.  3.  Hypothyroidism. - Continue Synthroid.  4.  Dementia. - Continue Aricept.  5.  BPH. - We will continue his saw palmetto  DVT prophylaxis: SCDs.  GI prophylaxis currently contraindicated due to GI bleeding. Code Status: full code. Family Communication:  The plan of care was discussed in details with the patient (and family). I answered all questions. The patient agreed to proceed with the above mentioned plan. Further management will depend upon hospital course. Disposition Plan: Back to previous home environment Consults called: GI consult.  All the records are reviewed and case discussed with ED provider.  Status is: Inpatient  Remains inpatient appropriate because:Ongoing diagnostic testing needed not appropriate for outpatient work up, Unsafe d/c plan, IV treatments appropriate due to intensity of illness or inability to take PO, and Inpatient level of care appropriate due to severity of illness   Dispo: The patient is from: Home              Anticipated d/c is to: Home              Patient currently is not medically stable to d/c.              Difficult to place patient: No  TOTAL TIME TAKING CARE OF THIS PATIENT: 55 minutes.     Christel Mormon M.D on 07/22/2021 at 1:42 AM  [facility]   From 7 PM-7 AM, contact night-coverage [email or web address]  CC: Primary care physician; Maryland Pink, MD

## 2021-07-23 ENCOUNTER — Observation Stay: Payer: Medicare Other | Admitting: Anesthesiology

## 2021-07-23 ENCOUNTER — Encounter: Payer: Self-pay | Admitting: Family Medicine

## 2021-07-23 ENCOUNTER — Encounter
Admission: EM | Disposition: A | Discharge status: Home / Self Care | Payer: Self-pay | Source: Home / Self Care | Attending: Emergency Medicine

## 2021-07-23 DIAGNOSIS — K922 Gastrointestinal hemorrhage, unspecified: Secondary | ICD-10-CM | POA: Diagnosis not present

## 2021-07-23 HISTORY — PX: COLONOSCOPY WITH PROPOFOL: SHX5780

## 2021-07-23 LAB — BASIC METABOLIC PANEL
Anion gap: 6 (ref 5–15)
BUN: 11 mg/dL (ref 8–23)
CO2: 25 mmol/L (ref 22–32)
Calcium: 8.7 mg/dL — ABNORMAL LOW (ref 8.9–10.3)
Chloride: 110 mmol/L (ref 98–111)
Creatinine, Ser: 0.84 mg/dL (ref 0.61–1.24)
GFR, Estimated: >60 mL/min (ref >60)
Glucose, Bld: 93 mg/dL (ref 70–99)
Potassium: 4 mmol/L (ref 3.5–5.1)
Sodium: 141 mmol/L (ref 135–145)

## 2021-07-23 LAB — CBC
HCT: 39.6 % (ref 39.0–52.0)
Hemoglobin: 13.2 g/dL (ref 13.0–17.0)
MCH: 32.2 pg (ref 26.0–34.0)
MCHC: 33.3 g/dL (ref 30.0–36.0)
MCV: 96.6 fL (ref 80.0–100.0)
Platelets: 201 10*3/uL (ref 150–400)
RBC: 4.1 MIL/uL — ABNORMAL LOW (ref 4.22–5.81)
RDW: 12.2 % (ref 11.5–15.5)
WBC: 5.3 10*3/uL (ref 4.0–10.5)
nRBC: 0 % (ref 0.0–0.2)

## 2021-07-23 LAB — HEMOGLOBIN A1C
Hgb A1c MFr Bld: 5.4 % (ref 4.8–5.6)
Mean Plasma Glucose: 108.28 mg/dL

## 2021-07-23 SURGERY — COLONOSCOPY WITH PROPOFOL
Anesthesia: General

## 2021-07-23 MED ORDER — PROPOFOL 500 MG/50ML IV EMUL
INTRAVENOUS | Status: DC | PRN
  Administered 2021-07-23: 120 ug/kg/min via INTRAVENOUS

## 2021-07-23 MED ORDER — PROPOFOL 10 MG/ML IV BOLUS
INTRAVENOUS | Status: DC | PRN
  Administered 2021-07-23: 70 mg via INTRAVENOUS

## 2021-07-23 MED ORDER — LIDOCAINE HCL (CARDIAC) PF 100 MG/5ML IV SOSY
PREFILLED_SYRINGE | INTRAVENOUS | Status: DC | PRN
  Administered 2021-07-23: 80 mg via INTRAVENOUS

## 2021-07-23 MED ORDER — EPHEDRINE SULFATE 50 MG/ML IJ SOLN
INTRAMUSCULAR | Status: DC | PRN
  Administered 2021-07-23: 10 mg via INTRAVENOUS

## 2021-07-23 NOTE — Op Note (Signed)
[facility] Gastroenterology Patient Name: Dylan Chen Procedure Date: 07/23/2021 10:08 AM MRN: [id number] Account #: [id number] Date of Birth: [birth year] Admit Type: Inpatient Age: 65 Room: [facility] ENDO ROOM 3 Gender: Male Note Status: Finalized Instrument Name: Jasper Riling [id number] Procedure:             Colonoscopy Indications:           Rectal bleeding Providers:             [name] MD, MD Referring MD:          Irven Easterly. Kary Kos, MD (Referring MD) Medicines:             Monitored Anesthesia Care Complications:         No immediate complications. Procedure:             Pre-Anesthesia Assessment:                        - Prior to the procedure, a History and Physical was                         performed, and patient medications, allergies and                         sensitivities were reviewed. The patient's tolerance                         of previous anesthesia was reviewed.                        - The risks and benefits of the procedure and the                         sedation options and risks were discussed with the                         patient. All questions were answered and informed                         consent was obtained.                        - ASA Grade Assessment: II - A patient with mild                         systemic disease.                        After obtaining informed consent, the colonoscope was                         passed under direct vision. Throughout the procedure,                         the patient's blood pressure, pulse, and oxygen                         saturations were monitored continuously. The                         Colonoscope was introduced through the  anus and                         advanced to the the cecum, identified by the                         appendiceal orifice. The colonoscopy was performed                         with ease. The patient tolerated the procedure well.                         The  quality of the bowel preparation was adequate. Findings:      The perianal and digital rectal examinations were normal.      Multiple small-mouthed diverticula were found in the sigmoid colon.      A few five mm ulcers were found in the ascending colon. No bleeding was       present. No stigmata of recent bleeding were seen.      Non-bleeding internal hemorrhoids were found during retroflexion. The       hemorrhoids were large and Grade II (internal hemorrhoids that prolapse       but reduce spontaneously).      The exam was otherwise without abnormality on direct and retroflexion       views. Impression:            - Diverticulosis in the sigmoid colon.                        - A few ulcers in the ascending colon.                        - Non-bleeding internal hemorrhoids.                        - The examination was otherwise normal on direct and                         retroflexion views.                        - No specimens collected. Recommendation:        - Return patient to hospital ward for ongoing care.                        - Advance diet as tolerated.                        - Continue present medications.                        - Return to my office in 2 weeks.                        - Refer to surgeon for hemorrhoid banding in 2 weeks. Procedure Code(s):     --- Professional ---                        [phone], Colonoscopy, flexible; diagnostic, including  collection of specimen(s) by brushing or washing, when                         performed (separate procedure) Diagnosis Code(s):     --- Professional ---                        K64.1, Second degree hemorrhoids                        K63.3, Ulcer of intestine                        K62.5, Hemorrhage of anus and rectum                        K57.30, Diverticulosis of large intestine without                         perforation or abscess without bleeding CPT copyright 2019 American Medical Association.  All rights reserved. The codes documented in this report are preliminary and upon coder review may  be revised to meet current compliance requirements. [name], MD [name] MD, MD 07/23/2021 10:32:40 AM This report has been signed electronically. Number of Addenda: 0 Note Initiated On: 07/23/2021 10:08 AM Scope Withdrawal Time: 0 hours 10 minutes 14 seconds  Total Procedure Duration: 0 hours 13 minutes 22 seconds  Estimated Blood Loss:  Estimated blood loss: none.      [facility]

## 2021-07-23 NOTE — Anesthesia Postprocedure Evaluation (Signed)
Anesthesia Post Note  Patient: Dylan Chen  Procedure(s) Performed: COLONOSCOPY WITH PROPOFOL  Patient location during evaluation: Endoscopy Anesthesia Type: General Level of consciousness: awake and alert Pain management: pain level controlled Vital Signs Assessment: post-procedure vital signs reviewed and stable Respiratory status: spontaneous breathing, nonlabored ventilation, respiratory function stable and patient connected to nasal cannula oxygen Cardiovascular status: blood pressure returned to baseline and stable Postop Assessment: no apparent nausea or vomiting Anesthetic complications: no   No notable events documented.   Last Vitals:  Vitals:   07/23/21 1054 07/23/21 1104  BP: 117/60 125/71  Pulse: 79 74  Resp: 18 11  Temp:    SpO2: 98% 97%    Last Pain:  Vitals:   07/23/21 1104  TempSrc:   PainSc: 0-No pain                 [facility]

## 2021-07-23 NOTE — Transfer of Care (Signed)
Immediate Anesthesia Transfer of Care Note  Patient: Dylan Chen  Procedure(s) Performed: COLONOSCOPY WITH PROPOFOL  Patient Location: PACU  Anesthesia Type:General  Level of Consciousness: awake, alert  and oriented  Airway & Oxygen Therapy: Patient Spontanous Breathing  Post-op Assessment: Report given to RN and Post -op Vital signs reviewed and stable  Post vital signs: Reviewed and stable  Last Vitals:  Vitals Value Taken Time  BP 89/47 07/23/21 1034  Temp 35.8 C 07/23/21 1034  Pulse 62 07/23/21 1035  Resp 15 07/23/21 1035  SpO2 97 % 07/23/21 1035  Vitals shown include unvalidated device data.  Last Pain:  Vitals:   07/23/21 1034  TempSrc: Temporal  PainSc: Asleep         Complications: No notable events documented.

## 2021-07-23 NOTE — H&P (Signed)
[name], MD [address] [place], [place], [place], [address] [address], [place], [place], [place], [address] Phone: [phone]  Fax: [phone]  Primary Care Physician:  Maryland Pink, MD   Pre-Procedure History & Physical: HPI:  Dylan Chen is a 65 y.o. male is here for an colonoscopy.   Past Medical History:  Diagnosis Date   Arthritis    RIGHT HAND   Cancer ([place])    THYROID   Dementia ([place])    GERD (gastroesophageal reflux disease)    History of hiatal hernia    Hyperlipemia    Hypertension    Hypothyroidism    Peptic ulcer    Sleep apnea    CPAP    Past Surgical History:  Procedure Laterality Date   COLONOSCOPY WITH PROPOFOL N/A 12/19/2017   Procedure: COLONOSCOPY WITH PROPOFOL;  Surgeon: Manya Silvas, MD;  Location: [facility] ENDOSCOPY;  Service: Endoscopy;  Laterality: N/A;   HIATAL HERNIA REPAIR     INGUINAL HERNIA REPAIR Left 06/16/2017   Procedure: HERNIA REPAIR INGUINAL ADULT;  Surgeon: Leonie Green, MD;  Location: [facility] ORS;  Service: General;  Laterality: Left;   JOINT REPLACEMENT Left    THR   ROTATOR CUFF REPAIR Right    SHOULDER ARTHROSCOPY WITH SUBACROMIAL DECOMPRESSION, ROTATOR CUFF REPAIR AND BICEP TENDON REPAIR Left 06/25/2021   Procedure: SHOULDER ARTHROSCOPY WITH DEBRIDEMENT, DECOMPRESSION, AND ROTATOR CUFF REPAIR.;  Surgeon: Corky Mull, MD;  Location: [facility] ORS;  Service: Orthopedics;  Laterality: Left;   THYROIDECTOMY     XI ROBOTIC ASSISTED INGUINAL HERNIA REPAIR WITH MESH Right 12/29/2020   Procedure: XI ROBOTIC ASSISTED INGUINAL HERNIA REPAIR WITH MESH;  Surgeon: Herbert Pun, MD;  Location: [facility] ORS;  Service: General;  Laterality: Right;    Prior to Admission medications   Medication Sig Start Date End Date Taking? Authorizing Provider  Ascorbic Acid (VITAMIN C) 1000 MG tablet Take 1,000 mg by mouth daily.    Yes [provider]  aspirin EC 81 MG tablet Take 81 mg by mouth at bedtime.    Yes [provider]  atorvastatin (LIPITOR) 20 MG tablet Take 20 mg by mouth daily.   Yes [provider]  Calcium Carb-Cholecalciferol (CALCIUM 600 + D PO) Take 2 tablets by mouth every evening.    Yes [provider]  cholecalciferol (VITAMIN D3) 25 MCG (1000 UNIT) tablet Take 1,000 Units by mouth at bedtime.   Yes [provider]  clobetasol cream (TEMOVATE) 6.37 % Apply 1 application topically 2 (two) times daily. Rash on arm 06/02/21  Yes [provider]  donepezil (ARICEPT) 10 MG tablet Take 10 mg by mouth at bedtime.   Yes [provider]  levothyroxine (SYNTHROID) 137 MCG tablet Take 137 mcg by mouth daily before breakfast. 04/30/17  Yes [provider]  Multiple Vitamin (MULTIVITAMIN WITH MINERALS) TABS tablet Take 1 tablet by mouth every evening.   Yes [provider]  naproxen sodium (ALEVE) 220 MG tablet Take 440 mg by mouth in the morning and at bedtime.   Yes [provider]  Omega-3 Fatty Acids (FISH OIL) 1000 MG CPDR Take 1,000 mg by mouth at bedtime.   Yes [provider]  pantoprazole (PROTONIX) 40 MG tablet Take 40 mg by mouth daily.   Yes [provider]  polyvinyl alcohol (LIQUIFILM TEARS) 1.4 % ophthalmic solution Place 1 drop into both eyes every 4 (four) hours as needed for dry eyes.   Yes [provider]  Saw Palmetto, Serenoa repens, 450 MG CAPS Take 1,350 mg by mouth in the morning and at bedtime.   Yes [provider]  vitamin B-12 (CYANOCOBALAMIN) 1000 MCG tablet Take 1,000 mcg by mouth daily.   Yes [provider]  vitamin E 180 MG (400 UNITS) capsule Take 400 Units by mouth daily.   Yes [provider]  oxyCODONE (ROXICODONE) 5 MG immediate release tablet Take 1-2 tablets (5-10 mg total) by mouth every 4 (four) hours as needed for severe pain or moderate pain. Patient not taking: Reported on 07/22/2021 06/25/21   Poggi, Marshall Cork, MD     Allergies as of 07/21/2021 - Review Complete 07/21/2021  Allergen Reaction Noted   Codeine Other (See Comments) 02/13/2015    History reviewed. No pertinent family history.  Social History   Socioeconomic History   Marital status: Married    Spouse name: Not on file   Number of children: Not on file   Years of education: Not on file   Highest education level: Not on file  Occupational History   Not on file  Tobacco Use   Smoking status: Former    Packs/day: 2.00    Years: 40.00    Pack years: 80.00    Types: Cigarettes, Cigars    Quit date: 06/10/1997    Years since quitting: 24.1   Smokeless tobacco: Former  Scientific laboratory technician Use: Never used  Substance and Sexual Activity   Alcohol use: No   Drug use: No   Sexual activity: Not on file  Other Topics Concern   Not on file  Social History Narrative   Not on file   Social Determinants of Health   Financial Resource Strain: Not on file  Food Insecurity: Not on file  Transportation Needs: Not on file  Physical Activity: Not on file  Stress: Not on file  Social Connections: Not on file  Intimate Partner Violence: Not on file    Review of Systems: See HPI, otherwise negative ROS  Physical Exam: BP (!) 150/73   Pulse 69   Temp (!) 96.6 F (35.9 C) (Temporal)   Resp 16   Ht 5\' 5"  (1.651 m)   Wt 77.1 kg   SpO2 98%   BMI 28.29 kg/m  General:   Alert,  pleasant and cooperative in NAD Head:  Normocephalic and atraumatic. Neck:  Supple; no masses or thyromegaly. Lungs:  Clear throughout to auscultation, normal respiratory effort.    Heart:  +S1, +S2, Regular rate and rhythm, No edema. Abdomen:  Soft, nontender and nondistended. Normal bowel sounds, without guarding, and without rebound.   Neurologic:  Alert and  oriented x4;  grossly normal neurologically.  Impression/Plan: Dylan Chen is here for an colonoscopy to be performed for rectal bleeding Risks, benefits, limitations, and alternatives  regarding  colonoscopy have been reviewed with the patient.  Questions have been answered.  All parties agreeable.   [name], MD  07/23/2021, 10:04 AM

## 2021-07-23 NOTE — Anesthesia Preprocedure Evaluation (Signed)
Anesthesia Evaluation  Patient identified by MRN, date of birth, ID band Patient awake    Reviewed: Allergy & Precautions, NPO status , Patient's Chart, lab work & pertinent test results  History of Anesthesia Complications Negative for: history of anesthetic complications  Airway Mallampati: III  TM Distance: >3 FB Neck ROM: full    Dental  (+) Chipped, Poor Dentition, Missing   Pulmonary neg shortness of breath, sleep apnea , former smoker,    Pulmonary exam normal        Cardiovascular Exercise Tolerance: Good hypertension, Normal cardiovascular exam     Neuro/Psych PSYCHIATRIC DISORDERS negative neurological ROS  negative psych ROS   GI/Hepatic Neg liver ROS, hiatal hernia, PUD, GERD  Medicated and Controlled,  Endo/Other  Hypothyroidism   Renal/GU negative Renal ROS  negative genitourinary   Musculoskeletal  (+) Arthritis ,   Abdominal   Peds  Hematology negative hematology ROS (+)   Anesthesia Other Findings Past Medical History: No date: Arthritis     Comment:  RIGHT HAND No date: Cancer ([place])     Comment:  THYROID No date: Dementia (HCC) No date: GERD (gastroesophageal reflux disease) No date: History of hiatal hernia No date: Hyperlipemia No date: Hypertension No date: Hypothyroidism No date: Peptic ulcer No date: Sleep apnea     Comment:  CPAP  Past Surgical History: 12/19/2017: COLONOSCOPY WITH PROPOFOL; N/A     Comment:  Procedure: COLONOSCOPY WITH PROPOFOL;  Surgeon: Manya Silvas, MD;  Location: [facility] ENDOSCOPY;  Service:               Endoscopy;  Laterality: N/A; No date: HIATAL HERNIA REPAIR 06/16/2017: INGUINAL HERNIA REPAIR; Left     Comment:  Procedure: HERNIA REPAIR INGUINAL ADULT;  Surgeon:               Leonie Green, MD;  Location: [facility] ORS;  Service:               General;  Laterality: Left; No date: JOINT REPLACEMENT; Left     Comment:  THR No date:  ROTATOR CUFF REPAIR; Right 06/25/2021: SHOULDER ARTHROSCOPY WITH SUBACROMIAL DECOMPRESSION,  ROTATOR CUFF REPAIR AND BICEP TENDON REPAIR; Left     Comment:  Procedure: SHOULDER ARTHROSCOPY WITH DEBRIDEMENT,               DECOMPRESSION, AND ROTATOR CUFF REPAIR.;  Surgeon: Corky Mull, MD;  Location: [facility] ORS;  Service: Orthopedics;                Laterality: Left; No date: THYROIDECTOMY 12/29/2020: XI ROBOTIC ASSISTED INGUINAL HERNIA REPAIR WITH MESH; Right     Comment:  Procedure: XI ROBOTIC ASSISTED INGUINAL HERNIA REPAIR               WITH MESH;  Surgeon: Herbert Pun, MD;                Location: [facility] ORS;  Service: General;  Laterality:               Right;  BMI    Body Mass Index: 28.29 kg/m      Reproductive/Obstetrics negative OB ROS                             Anesthesia Physical Anesthesia Plan  ASA:  3  Anesthesia Plan: General   Post-op Pain Management:    Induction: Intravenous  PONV Risk Score and Plan: Propofol infusion and TIVA  Airway Management Planned: Natural Airway and Nasal Cannula  Additional Equipment:   Intra-op Plan:   Post-operative Plan:   Informed Consent: I have reviewed the patients History and Physical, chart, labs and discussed the procedure including the risks, benefits and alternatives for the proposed anesthesia with the patient or authorized representative who has indicated his/her understanding and acceptance.     Dental Advisory Given  Plan Discussed with: Anesthesiologist, CRNA and Surgeon  Anesthesia Plan Comments: (Patient consented for risks of anesthesia including but not limited to:  - adverse reactions to medications - risk of airway placement if required - damage to eyes, teeth, lips or other oral mucosa - nerve damage due to positioning  - sore throat or hoarseness - Damage to heart, brain, nerves, lungs, other parts of body or loss of life  Patient voiced  understanding.)        Anesthesia Quick Evaluation

## 2021-07-23 NOTE — Discharge Summary (Signed)
Dylan Chen ASN:[id number] DOB: 11/29/1955 DOA: 07/22/2021  PCP: Maryland Pink, MD  Admit date: 07/22/2021 Discharge date: 07/23/2021  Time spent: 35 minutes  Recommendations for Outpatient Follow-up:  GI f/u 2 weeks General surgery referral for treatment of internal hemorrhoids PCP hospital f/u     Discharge Diagnoses:  Active Problems:   GI bleeding   Hypertension   OSA (obstructive sleep apnea)   Postsurgical hypothyroidism   Discharge Condition: stable  Diet recommendation: heart healthy  Filed Weights   07/21/21 1943 07/23/21 0944  Weight: 77.1 kg 77.1 kg    History of present illness:  Dylan Chen is a 65 y.o. male with medical history significant for hypertension, dyslipidemia, hypothyroidism, obstructive sleep apnea on home CPAP and dementia, presented to the emergency room with a [place] of intermittent bright red bleeding per rectum over the last couple of days.  He has seen occasional blood clots.  He denies any abdominal pain or cramps.  No fever or chills.  No nausea or vomiting or melena.  No chest pain or palpitations.  No cough or wheezing or hemoptysis.  No other bleeding diathesis  Hospital Course:  Patient presented with hematochezia. Hemodynamically stable, no anemia. Bleeding resolved. Colonoscopy revealed colon ulcers and large internal hemorrhoids, no active bleeding identified. This in the setting of daily aspirin and aleve. Aspirin appears to be for primary prevention. Advise holding aspirin and aleve. Will need f/u with GI in 2 weeks and have placed referral to gen surg for treatment of internal hemorrhoids.   Procedures: colonoscopy   Consultations: GI  Discharge Exam: Vitals:   07/23/21 1104 07/23/21 1159  BP: 125/71 (!) 141/84  Pulse: 74 62  Resp: 11 17  Temp:  97.6 F (36.4 C)  SpO2: 97% 96%    General exam: Appears calm and comfortable  Respiratory system: Clear to auscultation. Respiratory effort normal. Cardiovascular  system: S1 & S2 heard, RRR. No JVD, murmurs, rubs, gallops or clicks. No pedal edema. Gastrointestinal system: Abdomen is nondistended, soft and nontender. No organomegaly or masses felt. Normal bowel sounds heard. Central nervous system: Alert and oriented. No focal neurological deficits. Extremities: Symmetric 5 x 5 power. Skin: No rashes, lesions or ulcers Psychiatry: Judgement and insight appear normal. Mood & affect appropriate.   Discharge Instructions   Discharge Instructions     Diet - low sodium heart healthy   Complete by: As directed    Increase activity slowly   Complete by: As directed       Allergies as of 07/23/2021       Reactions   Codeine Other (See Comments)   Makes feel weird--"outer body experience"        Medication List     STOP taking these medications    aspirin EC 81 MG tablet   naproxen sodium 220 MG tablet Commonly known as: ALEVE   oxyCODONE 5 MG immediate release tablet Commonly known as: Roxicodone       TAKE these medications    atorvastatin 20 MG tablet Commonly known as: LIPITOR Take 20 mg by mouth daily.   CALCIUM 600 + D PO Take 2 tablets by mouth every evening.   cholecalciferol 25 MCG (1000 UNIT) tablet Commonly known as: VITAMIN D3 Take 1,000 Units by mouth at bedtime.   clobetasol cream 0.05 % Commonly known as: TEMOVATE Apply 1 application topically 2 (two) times daily. Rash on arm   donepezil 10 MG tablet Commonly known as: ARICEPT Take 10 mg by mouth at  bedtime.   Fish Oil 1000 MG Cpdr Take 1,000 mg by mouth at bedtime.   levothyroxine 137 MCG tablet Commonly known as: SYNTHROID Take 137 mcg by mouth daily before breakfast.   multivitamin with minerals Tabs tablet Take 1 tablet by mouth every evening.   pantoprazole 40 MG tablet Commonly known as: PROTONIX Take 40 mg by mouth daily.   polyvinyl alcohol 1.4 % ophthalmic solution Commonly known as: LIQUIFILM TEARS Place 1 drop into both eyes  every 4 (four) hours as needed for dry eyes.   Saw Palmetto (Serenoa repens) 450 MG Caps Take 1,350 mg by mouth in the morning and at bedtime.   vitamin B-12 1000 MCG tablet Commonly known as: CYANOCOBALAMIN Take 1,000 mcg by mouth daily.   vitamin C 1000 MG tablet Take 1,000 mg by mouth daily.   vitamin E 180 MG (400 UNITS) capsule Take 400 Units by mouth daily.       Allergies  Allergen Reactions   Codeine Other (See Comments)    Makes feel weird--"outer body experience"      The results of significant diagnostics from this hospitalization (including imaging, microbiology, ancillary and laboratory) are listed below for reference.    Significant Diagnostic Studies: [place] OR NERVE BLOCK-IMAGE ONLY [facility])  Result Date: 06/25/2021 There is no interpretation for this exam.  This order is for images obtained during a surgical procedure.  Please See "Surgeries" Tab for more information regarding the procedure.    Microbiology: Recent Results (from the past 240 hour(s))  Resp Panel by RT-PCR (Flu A&B, Covid) Nasopharyngeal Swab     Status: None   Collection Time: 07/22/21 12:50 AM   Specimen: Nasopharyngeal Swab; Nasopharyngeal(NP) swabs in vial transport medium  Result Value Ref Range Status   SARS Coronavirus 2 by RT PCR NEGATIVE NEGATIVE Final    Comment: (NOTE) SARS-CoV-2 target nucleic acids are NOT DETECTED.  The SARS-CoV-2 RNA is generally detectable in upper respiratory specimens during the acute phase of infection. The lowest concentration of SARS-CoV-2 viral copies this assay can detect is 138 copies/mL. A negative result does not preclude SARS-Cov-2 infection and should not be used as the sole basis for treatment or other patient management decisions. A negative result may occur with  improper specimen collection/handling, submission of specimen other than nasopharyngeal swab, presence of viral mutation(s) within the areas targeted by this assay, and inadequate  number of viral copies(<138 copies/mL). A negative result must be combined with clinical observations, patient history, and epidemiological information. The expected result is Negative.  Fact Sheet for Patients:  EntrepreneurPulse.com.au  Fact Sheet for Healthcare Providers:  IncredibleEmployment.be  This test is no t yet approved or cleared by the [place] FDA and  has been authorized for detection and/or diagnosis of SARS-CoV-2 by FDA under an Emergency Use Authorization (EUA). This EUA will remain  in effect (meaning this test can be used) for the duration of the COVID-19 declaration under Section 564(b)(1) of the Act, 21 U.S.C.section 360bbb-3(b)(1), unless the authorization is terminated  or revoked sooner.       Influenza A by PCR NEGATIVE NEGATIVE Final   Influenza B by PCR NEGATIVE NEGATIVE Final    Comment: (NOTE) The Xpert Xpress SARS-CoV-2/FLU/RSV plus assay is intended as an aid in the diagnosis of influenza from Nasopharyngeal swab specimens and should not be used as a sole basis for treatment. Nasal washings and aspirates are unacceptable for Xpert Xpress SARS-CoV-2/FLU/RSV testing.  Fact Sheet for Patients: EntrepreneurPulse.com.au  Fact Sheet for  Healthcare Providers: IncredibleEmployment.be  This test is not yet approved or cleared by the [place] and has been authorized for detection and/or diagnosis of SARS-CoV-2 by FDA under an Emergency Use Authorization (EUA). This EUA will remain in effect (meaning this test can be used) for the duration of the COVID-19 declaration under Section 564(b)(1) of the Act, 21 U.S.C. section 360bbb-3(b)(1), unless the authorization is terminated or revoked.  Performed at [facility], [address] [place], [place] [address]      Labs: Basic Metabolic Panel: Recent Labs  Lab 07/21/21 1945 07/22/21 0324 07/23/21 0235   NA 139 139 141  K 3.7 3.9 4.0  CL 103 105 110  CO2 27 27 25   GLUCOSE 173* 104* 93  BUN 17 19 11   CREATININE 1.14 0.95 0.84  CALCIUM 9.2 8.8* 8.7*   Liver Function Tests: Recent Labs  Lab 07/21/21 1945  AST 31  ALT 25  ALKPHOS 84  BILITOT 1.8*  PROT 6.6  ALBUMIN 4.3   No results for input(s): LIPASE, AMYLASE in the last 168 hours. No results for input(s): AMMONIA in the last 168 hours. CBC: Recent Labs  Lab 07/21/21 1945 07/22/21 0040 07/22/21 0324 07/22/21 1546 07/23/21 0235  WBC 5.1 6.7 5.0  --  5.3  NEUTROABS  --  4.2  --   --   --   HGB 14.5 14.5 13.6 13.2 13.2  HCT 41.3 41.2 38.8* 38.3* 39.6  MCV 94.9 95.8 96.0  --  96.6  PLT 228 240 207  --  201   Cardiac Enzymes: No results for input(s): CKTOTAL, CKMB, CKMBINDEX, TROPONINI in the last 168 hours. BNP: BNP (last 3 results) No results for input(s): BNP in the last 8760 hours.  ProBNP (last 3 results) No results for input(s): PROBNP in the last 8760 hours.  CBG: No results for input(s): GLUCAP in the last 168 hours.     Signed:  Desma Maxim MD.  [facility] 07/23/2021, 12:31 PM

## 2021-07-24 ENCOUNTER — Encounter: Payer: Self-pay | Admitting: Gastroenterology

## 2021-08-10 ENCOUNTER — Ambulatory Visit: Payer: Medicare Other | Admitting: Surgery

## 2021-08-10 ENCOUNTER — Encounter: Payer: Self-pay | Admitting: Surgery

## 2021-08-10 ENCOUNTER — Other Ambulatory Visit: Payer: Self-pay

## 2021-08-10 VITALS — BP 141/77 | HR 86 | Temp 98.6°F | Ht 65.0 in | Wt 171.8 lb

## 2021-08-10 DIAGNOSIS — K641 Second degree hemorrhoids: Secondary | ICD-10-CM | POA: Diagnosis not present

## 2021-08-10 NOTE — Patient Instructions (Addendum)
Referral sent to [facility] for Hemorrhoid Banding. Someone from their office will call to schedule an appointment.   Constipation, Adult Constipation is when a person has fewer than three bowel movements in a week, has difficulty having a bowel movement, or has stools (feces) that are dry, hard, or larger than normal. Constipation may be caused by an underlying condition. It may become worse with age if a person takes certain medicines and does not take in enough fluids. Follow these instructions at home: Eating and drinking  Eat foods that have a lot of fiber, such as beans, whole grains, and fresh fruits and vegetables. Limit foods that are low in fiber and high in fat and processed sugars, such as fried or sweet foods. These include french fries, hamburgers, cookies, candies, and soda. Drink enough fluid to keep your urine pale yellow. General instructions Exercise regularly or as told by your health care provider. Try to do 150 minutes of moderate exercise each week. Use the bathroom when you have the urge to go. Do not hold it in. Take over-the-counter and prescription medicines only as told by your health care provider. This includes any fiber supplements. During bowel movements: Practice deep breathing while relaxing the lower abdomen. Practice pelvic floor relaxation. Watch your condition for any changes. Let your health care provider know about them. Keep all follow-up visits as told by your health care provider. This is important. Contact a health care provider if: You have pain that gets worse. You have a fever. You do not have a bowel movement after 4 days. You vomit. You are not hungry or you lose weight. You are bleeding from the opening between the buttocks (anus). You have thin, pencil-like stools. Get help right away if: You have a fever and your symptoms suddenly get worse. You leak stool or have blood in your stool. Your abdomen is bloated. You have  severe pain in your abdomen. You feel dizzy or you faint. Summary Constipation is when a person has fewer than three bowel movements in a week, has difficulty having a bowel movement, or has stools (feces) that are dry, hard, or larger than normal. Eat foods that have a lot of fiber, such as beans, whole grains, and fresh fruits and vegetables. Drink enough fluid to keep your urine pale yellow. Take over-the-counter and prescription medicines only as told by your health care provider. This includes any fiber supplements. This information is not intended to replace advice given to you by your health care provider. Make sure you discuss any questions you have with your health care provider. Document Revised: 07/18/2019 Document Reviewed: 07/18/2019 Elsevier Patient Education  [place]. [employer] Content in Foods Fiber is a substance that is found in plant foods, such as fruits, vegetables, whole grains, nuts, seeds, and beans. As part of your treatment and recovery plan, your health care provider may recommend that you eat foods that have specific amounts of dietary fiber. Some conditions may require a high-fiber diet while others may require a low-fiber diet. This sheet gives you information about the dietary fiber content of some common foods. Your health care provider will tell you how much fiber you need in your diet. If you have problems or questions, contact your health care provider or dietitian. What foods are high in fiber? Fruits Blackberries or raspberries (fresh) --  cup (75 g) has 4 g of fiber. Pear (fresh) -- 1 medium (180 g) has 5.5 g of fiber. Prunes (dried) -- 6 to  8 pieces (57-76 g) has 5 g of fiber. Apple with skin -- 1 medium (182 g) has 4.8 g of fiber. Guava -- 1 cup (128 g) has 8.9 g of fiber. Vegetables Peas (frozen) --  cup (80 g) has 4.4 g of fiber. Potato with skin (baked) -- 1 medium (173 g) has 4.4 g of fiber. Pumpkin (canned) --  cup (122 g) has 5 g of  fiber. Brussels sprouts (cooked) --  cup (78 g) has 4 g of fiber. Sweet potato --  cup mashed (124 g) has 4 g of fiber. Winter squash -- 1 cup cooked (205 g) has 5.7 g of fiber. Grains Bran cereal --  cup (31 g) has 8.6 g of fiber. Bulgur (cooked) --  cup (70 g) has 4 g of fiber. Quinoa (cooked) -- 1 cup (185 g) has 5.2 g of fiber. Popcorn -- 3 cups (375 g) popped has 5.8 g of fiber. Spaghetti, whole wheat -- 1 cup (140 g) has 6 g of fiber. Meats and other proteins Pinto beans (cooked) --  cup (90 g) has 7.7 g of fiber. Lentils (cooked) --  cup (90 g) has 7.8 g of fiber. Kidney beans (canned) --  cup (92.5 g) has 5.7 g of fiber. Soybeans (canned, frozen, or fresh) --  cup (92.5 g) has 5.2 g of fiber. Baked beans, plain or vegetarian (canned) --  cup (130 g) has 5.2 g of fiber. Garbanzo beans or chickpeas (canned) --  cup (90 g) has 6.6 g of fiber. Black beans (cooked) --  cup (86 g) has 7.5 g of fiber. White beans or navy beans (cooked) --  cup (91 g) has 9.3 g of fiber. The items listed above may not be a complete list of foods with high fiber. Actual amounts of fiber may be different depending on processing. Contact a dietitian for more information. What foods are moderate in fiber? Fruits Banana -- 1 medium (126 g) has 3.2 g of fiber. Melon -- 1 cup (155 g) has 1.4 g of fiber. Orange -- 1 small (154 g) has 3.7 g of fiber. Raisins --  cup (40 g) has 1.8 g of fiber. Applesauce, sweetened --  cup (125 g) has 1.5 g of fiber. Blueberries (fresh) --  cup (75 g) has 1.8 g of fiber. Strawberries (fresh, sliced) -- 1 cup (518 g) has 3 g of fiber. Cherries -- 1 cup (140 g) has 2.9 g of fiber. Vegetables Broccoli (cooked) --  cup (77.5 g) has 2.1 g of fiber. Carrots (cooked) --  cup (77.5 g) has 2.2 g of fiber. Corn (canned or frozen) --  cup (82.5 g) has 2.1 g of fiber. Potatoes, mashed --  cup (105 g) has 1.6 g of fiber. Tomato -- 1 medium (62 g) has 1.5 g of  fiber. Green beans (canned) --  cup (83 g) has 2 g of fiber. Squash, winter --  cup (58 g) has 1 g of fiber. Sweet potato, baked -- 1 medium (150 g) has 3 g of fiber. Cauliflower (cooked) -- 1/2 cup (90 g) has 2.3 g of fiber. Grains Long-grain brown rice (cooked) -- 1 cup (196 g) has 3.5 g of fiber. Bagel, plain -- one 4-inch (10 cm) bagel has 2 g of fiber. Instant oatmeal --  cup (120 g) has about 2 g of fiber. Macaroni noodles, enriched (cooked) -- 1 cup (140 g) has 2.5 g of fiber. Multigrain cereal --  cup (15 g) has about 2-4 g  of fiber. Whole-wheat bread -- 1 slice (26 g) has 2 g of fiber. Whole-wheat spaghetti noodles --  cup (70 g) has 3.2 g of fiber. Corn tortilla -- one 6-inch (15 cm) tortilla has 1.5 g of fiber. Meats and other proteins Almonds --  cup or 1 oz (28 g) has 3.5 g of fiber. Sunflower seeds in shell --  cup or  oz (11.5 g) has 1.1 g of fiber. Vegetable or soy patty -- 1 patty (70 g) has 3.4 g of fiber. Walnuts --  cup or 1 oz (30 g) has 2 g of fiber. Flax seed -- 1 Tbsp (7 g) has 2.8 g of fiber. The items listed above may not be a complete list of foods that have moderate amounts of fiber. Actual amounts of fiber may be different depending on processing. Contact a dietitian for more information. What foods are low in fiber? Low-fiber foods contain less than 1 g of fiber per serving. They include: Fruits Fruit juice --  cup or 4 fl oz (118 mL) has 0.5 g of fiber. Vegetables Lettuce -- 1 cup (35 g) has 0.5 g of fiber. Cucumber (slices) --  cup (60 g) has 0.3 g of fiber. Celery -- 1 stalk (40 g) has 0.1 g of fiber. Grains Flour tortilla -- one 6-inch (15 cm) tortilla has 0.5 g of fiber. White rice (cooked) --  cup (81.5 g) has 0.3 g of fiber. Meats and other proteins Egg -- 1 large (50 g) has 0 g of fiber. Meat, poultry, or fish -- 3 oz (85 g) has 0 g of fiber. Dairy Milk -- 1 cup or 8 fl oz (237 mL) has 0 g of fiber. Yogurt -- 1 cup (245 g) has 0 g  of fiber. The items listed above may not be a complete list of foods that are low in fiber. Actual amounts of fiber may be different depending on processing. Contact a dietitian for more information. Summary Fiber is a substance that is found in plant foods, such as fruits, vegetables, whole grains, nuts, seeds, and beans. As part of your treatment and recovery plan, your health care provider may recommend that you eat foods that have specific amounts of dietary fiber. This information is not intended to replace advice given to you by your health care provider. Make sure you discuss any questions you have with your health care provider. Document Revised: 01/03/2020 Document Reviewed: 01/03/2020 Elsevier Patient Education  2022 [place]. How to Take a [employer] A sitz bath is a warm water bath that may be used to care for your rectum, genital area, or the area between your rectum and genitals (perineum). In a sitz bath, the water only comes up to your hips and covers your buttocks. A sitz bath may be done in a bathtub or with a portable sitz bath that fits over the toilet. Your health care provider may recommend a sitz bath to help: Relieve pain and discomfort after delivering a baby. Relieve pain and itching from hemorrhoids or anal fissures. Relieve pain after certain surgeries. Relax muscles that are sore or tight. How to take a sitz bath Take 3-4 sitz baths a day, or as many as told by your health care provider. Bathtub sitz bath To take a sitz bath in a bathtub: Partially fill a bathtub with warm water. The water should be deep enough to cover your hips and buttocks when you are sitting in the tub. Follow your health care provider's instructions  if you are told to put medicine in the water. Sit in the water. Open the tub drain a little, and leave it open during your bath. Turn on the warm water again, enough to replace the water that is draining out. Keep the water running throughout  your bath. This helps keep the water at the right level and temperature. Soak in the water for 15-20 minutes, or as long as told by your health care provider. When you are done, be careful when you stand up. You may feel dizzy. After the sitz bath, pat yourself dry. Do not rub your skin to dry it.  Over-the-toilet sitz bath To take a sitz bath with an over-the-toilet basin: Follow the manufacturer's instructions. Fill the basin with warm water. Follow your health care provider's instructions if you were told to put medicine in the water. Sit on the seat. Make sure the water covers your buttocks and perineum. Soak in the water for 15-20 minutes, or as long as told by your health care provider. After the sitz bath, pat yourself dry. Do not rub your skin to dry it. Clean and dry the basin between uses. Discard the basin if it cracks, or according to the manufacturer's instructions.  Contact a health care provider if: Your pain or itching gets worse. Do not continue with sitz baths if your symptoms get worse. You have new symptoms. Do not continue with sitz baths until you talk with your health care provider. Summary A sitz bath is a warm water bath in which the water only comes up to your hips and covers your buttocks. A sitz bath may help relieve pain and discomfort after delivering a baby. It also may help with pain and itching from hemorrhoids or anal fissures, or pain after certain surgeries. It can also help to relax muscles that are sore or tight. Take 3-4 sitz baths a day, or as many as told by your health care provider. Soak in the water for 15-20 minutes. Do not continue with sitz baths if your symptoms get worse. This information is not intended to replace advice given to you by your health care provider. Make sure you discuss any questions you have with your health care provider. Document Revised: 05/15/2020 Document Reviewed: 05/15/2020 Elsevier Patient Education  2022 [place]. Hemorrhoids Hemorrhoids are swollen veins in and around the rectum or anus. There are two types of hemorrhoids: Internal hemorrhoids. These occur in the veins that are just inside the rectum. They may poke through to the outside and become irritated and painful. External hemorrhoids. These occur in the veins that are outside the anus and can be felt as a painful swelling or hard lump near the anus. Most hemorrhoids do not cause serious problems, and they can be managed with home treatments such as diet and lifestyle changes. If home treatments do not help the symptoms, procedures can be done to shrink or remove the hemorrhoids. What are the causes? This condition is caused by increased pressure in the anal area. This pressure may result from various things, including: Constipation. Straining to have a bowel movement. Diarrhea. Pregnancy. Obesity. Sitting for long periods of time. Heavy lifting or other activity that causes you to strain. Anal sex. Riding a bike for a long period of time. What are the signs or symptoms? Symptoms of this condition include: Pain. Anal itching or irritation. Rectal bleeding. Leakage of stool (feces). Anal swelling. One or more lumps around the anus. How is this diagnosed? This condition  can often be diagnosed through a visual exam. Other exams or tests may also be done, such as: An exam that involves feeling the rectal area with a gloved hand (digital rectal exam). An exam of the anal canal that is done using a small tube (anoscope). A blood test, if you have lost a significant amount of blood. A test to look inside the colon using a flexible tube with a camera on the end (sigmoidoscopy or colonoscopy). How is this treated? This condition can usually be treated at home. However, various procedures may be done if dietary changes, lifestyle changes, and other home treatments do not help your symptoms. These procedures can help make the hemorrhoids  smaller or remove them completely. Some of these procedures involve surgery, and others do not. Common procedures include: Rubber band ligation. Rubber bands are placed at the base of the hemorrhoids to cut off their blood supply. Sclerotherapy. Medicine is injected into the hemorrhoids to shrink them. Infrared coagulation. A type of light energy is used to get rid of the hemorrhoids. Hemorrhoidectomy surgery. The hemorrhoids are surgically removed, and the veins that supply them are tied off. Stapled hemorrhoidopexy surgery. The surgeon staples the base of the hemorrhoid to the rectal wall. Follow these instructions at home: Eating and drinking  Eat foods that have a lot of fiber in them, such as whole grains, beans, nuts, fruits, and vegetables. Ask your health care provider about taking products that have added fiber (fiber supplements). Reduce the amount of fat in your diet. You can do this by eating low-fat dairy products, eating less red meat, and avoiding processed foods. Drink enough fluid to keep your urine pale yellow. Managing pain and swelling  Take warm sitz baths for 20 minutes, 3-4 times a day to ease pain and discomfort. You may do this in a bathtub or using a portable sitz bath that fits over the toilet. If directed, apply ice to the affected area. Using ice packs between sitz baths may be helpful. Put ice in a plastic bag. Place a towel between your skin and the bag. Leave the ice on for 20 minutes, 2-3 times a day. General instructions Take over-the-counter and prescription medicines only as told by your health care provider. Use medicated creams or suppositories as told. Get regular exercise. Ask your health care provider how much and what kind of exercise is best for you. In general, you should do moderate exercise for at least 30 minutes on most days of the week (150 minutes each week). This can include activities such as walking, biking, or yoga. Go to the bathroom when  you have the urge to have a bowel movement. Do not wait. Avoid straining to have bowel movements. Keep the anal area dry and clean. Use wet toilet paper or moist towelettes after a bowel movement. Do not sit on the toilet for long periods of time. This increases blood pooling and pain. Keep all follow-up visits as told by your health care provider. This is important. Contact a health care provider if you have: Increasing pain and swelling that are not controlled by treatment or medicine. Difficulty having a bowel movement, or you are unable to have a bowel movement. Pain or inflammation outside the area of the hemorrhoids. Get help right away if you have: Uncontrolled bleeding from your rectum. Summary Hemorrhoids are swollen veins in and around the rectum or anus. Most hemorrhoids can be managed with home treatments such as diet and lifestyle changes. Taking warm sitz  baths can help ease pain and discomfort. In severe cases, procedures or surgery can be done to shrink or remove the hemorrhoids. This information is not intended to replace advice given to you by your health care provider. Make sure you discuss any questions you have with your health care provider. Document Revised: 03/11/2021 Document Reviewed: 03/11/2021 Elsevier Patient Education  [place].

## 2021-08-10 NOTE — Progress Notes (Signed)
08/10/2021  Reason for Visit:  Internal hemorrhoids  Requesting Provider:  [name], MD  History of Present Illness: Dylan Chen is a 65 y.o. male presenting for evaluation of internal hemorrhoids.  The patient was recently admitted on 07/22/21 with lower GI bleed with bright red blood per rectum.  He had not had any prior episodes of this.  He reports that the day before presentation he started having significant blood in the toilet bowl.  On workup in the ED, his Hgb was 14.5.  He had a colonoscopy with Dr. Vicente Males which showed a few small ulcers in the ascending colon, as well as large internal hemorrhoids, both without any evidence of active bleeding.  He was at the time on Aspirin and NSAIDs and both were discontinued following his hospitalization.    Today, the patient reports that he has not had any other episodes of bleeding.  Denies any abdominal pain, nausea, vomiting.  Past Medical History: Past Medical History:  Diagnosis Date   Arthritis    RIGHT HAND   Cancer ([place])    THYROID   Dementia ([place])    GERD (gastroesophageal reflux disease)    History of hiatal hernia    Hyperlipemia    Hypertension    Hypothyroidism    Peptic ulcer    Sleep apnea    CPAP     Past Surgical History: Past Surgical History:  Procedure Laterality Date   COLONOSCOPY WITH PROPOFOL N/A 12/19/2017   Procedure: COLONOSCOPY WITH PROPOFOL;  Surgeon: Manya Silvas, MD;  Location: [facility] ENDOSCOPY;  Service: Endoscopy;  Laterality: N/A;   COLONOSCOPY WITH PROPOFOL N/A 07/23/2021   Procedure: COLONOSCOPY WITH PROPOFOL;  Surgeon: [name], MD;  Location: [facility] ENDOSCOPY;  Service: Gastroenterology;  Laterality: N/A;   HIATAL HERNIA REPAIR     INGUINAL HERNIA REPAIR Left 06/16/2017   Procedure: HERNIA REPAIR INGUINAL ADULT;  Surgeon: Leonie Green, MD;  Location: [facility] ORS;  Service: General;  Laterality: Left;   JOINT REPLACEMENT Left    THR   ROTATOR CUFF REPAIR Right    SHOULDER  ARTHROSCOPY WITH SUBACROMIAL DECOMPRESSION, ROTATOR CUFF REPAIR AND BICEP TENDON REPAIR Left 06/25/2021   Procedure: SHOULDER ARTHROSCOPY WITH DEBRIDEMENT, DECOMPRESSION, AND ROTATOR CUFF REPAIR.;  Surgeon: Corky Mull, MD;  Location: [facility] ORS;  Service: Orthopedics;  Laterality: Left;   THYROIDECTOMY     XI ROBOTIC ASSISTED INGUINAL HERNIA REPAIR WITH MESH Right 12/29/2020   Procedure: XI ROBOTIC ASSISTED INGUINAL HERNIA REPAIR WITH MESH;  Surgeon: Herbert Pun, MD;  Location: [facility] ORS;  Service: General;  Laterality: Right;    Home Medications: Prior to Admission medications   Medication Sig Start Date End Date Taking? Authorizing Provider  Ascorbic Acid (VITAMIN C) 1000 MG tablet Take 1,000 mg by mouth daily.     [provider]  atorvastatin (LIPITOR) 20 MG tablet Take 20 mg by mouth daily.    [provider]  Calcium Carb-Cholecalciferol (CALCIUM 600 + D PO) Take 2 tablets by mouth every evening.     [provider]  cholecalciferol (VITAMIN D3) 25 MCG (1000 UNIT) tablet Take 1,000 Units by mouth at bedtime.    [provider]  clobetasol cream (TEMOVATE) 4.09 % Apply 1 application topically 2 (two) times daily. Rash on arm 06/02/21   [provider]  donepezil (ARICEPT) 10 MG tablet Take 10 mg by mouth at bedtime.    [provider]  levothyroxine (SYNTHROID) 137 MCG tablet Take 137 mcg by mouth daily  before breakfast. 04/30/17   [provider]  Multiple Vitamin (MULTIVITAMIN WITH MINERALS) TABS tablet Take 1 tablet by mouth every evening.    [provider]  Omega-3 Fatty Acids (FISH OIL) 1000 MG CPDR Take 1,000 mg by mouth at bedtime.    [provider]  pantoprazole (PROTONIX) 40 MG tablet Take 40 mg by mouth daily.    [provider]  polyvinyl alcohol (LIQUIFILM TEARS) 1.4 % ophthalmic solution Place 1 drop into both eyes every 4 (four) hours as needed for dry eyes.    [provider]  Saw Palmetto, Serenoa repens, 450 MG CAPS Take 1,350 mg by mouth in the morning and at bedtime.    [provider]  vitamin B-12 (CYANOCOBALAMIN) 1000 MCG tablet Take 1,000 mcg by mouth daily.    [provider]  vitamin E 180 MG (400 UNITS) capsule Take 400 Units by mouth daily.    [provider]    Allergies: Allergies  Allergen Reactions   Codeine Other (See Comments)    Makes feel weird--"outer body experience"    Social History:  reports that he quit smoking about 24 years ago. His smoking use included cigarettes and cigars. He has a 80.00 pack-year smoking history. He has quit using smokeless tobacco. He reports that he does not drink alcohol and does not use drugs.   Family History: History reviewed. No pertinent family history.  Review of Systems: Review of Systems  Constitutional:  Negative for chills and fever.  Respiratory:  Negative for shortness of breath.   Cardiovascular:  Negative for chest pain.  Gastrointestinal:  Negative for abdominal pain, constipation, nausea and vomiting.   Physical Exam BP (!) 141/77   Pulse 86   Temp 98.6 F (37 C) (Oral)   Ht 5' 5"  (1.651 m)   Wt 171 lb 12.8 oz (77.9 kg)   SpO2 97%   BMI 28.59 kg/m  CONSTITUTIONAL: No acute distress, well nourished. HEENT:  Normocephalic, atraumatic, extraocular motion intact. RESPIRATORY:  Lungs are clear, and breath sounds are equal bilaterally. Normal respiratory effort without pathologic use of accessory muscles. CARDIOVASCULAR: Heart is regular without murmurs, gallops, or rubs. GI: The abdomen is soft, non-distended, non-tender.  RECTAL:  External exam reveals mildly enlarged external hemorrhoids without any significant inflammation and no tenderness.  On digital rectal exam, the patient does have enlarged internal hemorrhoids of all columns, without any gross blood. NEUROLOGIC:  Motor and sensation is grossly normal.  Cranial nerves are grossly  intact. PSYCH:  Alert and oriented to person, place and time. Affect is normal.  Laboratory Analysis: Labs from 07/21/21: Na 139, K 3.7, Cl 103, CO2 27, BUN 17, Cr 1.14.  Total bili 1.8, AST 31, ALT 25, Alk Phos 84, Albumin 4.3.  WBC 5.1, Hgb 14.5, Hct 41.3, Plt 228  Imaging: Colonoscopy 07/23/21: IMPRESSION: - Diverticulosis in the sigmoid colon. - A few ulcers in the ascending colon. - Non-bleeding internal hemorrhoids. - The examination was otherwise normal on direct and retroflexion views. - No specimens collected.  Assessment and Plan: This is a 64 y.o. male with recent admission for LGIB with BRBPR and findings of enlarged internal hemorrhoids.  --Discussed with the patient the findings on his colonoscopy as well as his exam today.  Discussed with him the possibility for surgical management of his enlarged internal hemorrhoids given the size and the likelihood that these were the source of his bleeding.  Discussed possible conservative measures, but given the significant bleeding he  had, I think it would be worthwhile to proceed with surgical management.  He reports that he has been through many surgeries and would rather avoid surgery if possible and asked about the possibility of banding the hemorrhoid tissue.  Discussed with him that banding would be appropriate for internal hemorrhoids but not for external hemorrhoids.  In his case, he has not had issues with his external hemorrhoids thus far, so could possibly do only banding of the internal and do watchful waiting to see if the external hemorrhoids flare up in the future.  He is in agreement with this option. --Discussed with him that unfortunately I do not do banding procedures, but we can refer him back to GI team for evaluation and in-office banding of the internal hemorrhoids.  He is much appreciative of the alternative and will get in touch with [place] if there are any further issues or questions. --Follow up as needed.  I spent 40  minutes dedicated to the care of this patient on the date of this encounter to include pre-visit review of records, face-to-face time with the patient discussing diagnosis and management, and any post-visit coordination of care.   Melvyn Neth, [facility]

## 2021-09-01 ENCOUNTER — Ambulatory Visit (INDEPENDENT_AMBULATORY_CARE_PROVIDER_SITE_OTHER): Payer: Medicare Other | Admitting: Gastroenterology

## 2021-09-01 ENCOUNTER — Encounter: Payer: Self-pay | Admitting: Gastroenterology

## 2021-09-01 VITALS — BP 150/87 | HR 62 | Temp 98.1°F | Ht 65.0 in | Wt 170.6 lb

## 2021-09-01 DIAGNOSIS — K640 First degree hemorrhoids: Secondary | ICD-10-CM

## 2021-09-01 DIAGNOSIS — K625 Hemorrhage of anus and rectum: Secondary | ICD-10-CM

## 2021-09-01 DIAGNOSIS — E785 Hyperlipidemia, unspecified: Secondary | ICD-10-CM | POA: Insufficient documentation

## 2021-09-01 NOTE — Progress Notes (Signed)

## 2021-09-01 NOTE — Progress Notes (Signed)
Cephas Darby, MD [address]  [place]  [place], [place] [address]  Main: [phone]  Fax: [phone]    Gastroenterology Consultation  Referring Provider:     Olean Ree, MD Primary Care Physician:  Mary[place] Pink, MD Primary Gastroenterologist:  Dr. [name] Reason for Consultation:     Painless rectal bleeding        HPI:   Dylan Chen is a 65 y.o. male referred by Dr. Maryland Pink, MD  for consultation & management of painless rectal bleeding.  Patient was admitted to [facility] in early November secondary to rectal bleeding, with mild drop in hemoglobin.  He underwent colonoscopy which revealed large internal hemorrhoids, diverticulosis of the sigmoid colon.  He is referred to be seen in the GI clinic to discuss about hemorrhoid banding.  Patient reports that he has been experiencing intermittent rectal bleeding, bright red blood since discharge.  He denies any constipation, straining or prolonged sitting on the toilet.  Patient is accompanied by his wife today.  Patient is not on any anticoagulation.  His hemoglobin is normal.  Patient's last bleeding episode was this morning.  Patient does not smoke or drink alcohol  NSAIDs: None  Antiplts/Anticoagulants/Anti thrombotics: None  GI Procedures: Colonoscopy 07/23/2021 - Diverticulosis in the sigmoid colon. - A few ulcers in the ascending colon. - Non-bleeding internal hemorrhoids. - The examination was otherwise normal on direct and retroflexion views. - No specimens collected.   Past Medical History:  Diagnosis Date   Arthritis    RIGHT HAND   Cancer ([place])    THYROID   Dementia ([place])    GERD (gastroesophageal reflux disease)    History of hiatal hernia    Hyperlipemia    Hypertension    Hypothyroidism    Peptic ulcer    Sleep apnea    CPAP    Past Surgical History:  Procedure Laterality Date   COLONOSCOPY WITH PROPOFOL N/A 12/19/2017   Procedure: COLONOSCOPY WITH PROPOFOL;  Surgeon:  Manya Silvas, MD;  Location: [facility] ENDOSCOPY;  Service: Endoscopy;  Laterality: N/A;   COLONOSCOPY WITH PROPOFOL N/A 07/23/2021   Procedure: COLONOSCOPY WITH PROPOFOL;  Surgeon: [name], MD;  Location: [facility] ENDOSCOPY;  Service: Gastroenterology;  Laterality: N/A;   HIATAL HERNIA REPAIR     INGUINAL HERNIA REPAIR Left 06/16/2017   Procedure: HERNIA REPAIR INGUINAL ADULT;  Surgeon: Leonie Green, MD;  Location: [facility] ORS;  Service: General;  Laterality: Left;   JOINT REPLACEMENT Left    THR   ROTATOR CUFF REPAIR Right    SHOULDER ARTHROSCOPY WITH SUBACROMIAL DECOMPRESSION, ROTATOR CUFF REPAIR AND BICEP TENDON REPAIR Left 06/25/2021   Procedure: SHOULDER ARTHROSCOPY WITH DEBRIDEMENT, DECOMPRESSION, AND ROTATOR CUFF REPAIR.;  Surgeon: Corky Mull, MD;  Location: [facility] ORS;  Service: Orthopedics;  Laterality: Left;   THYROIDECTOMY     XI ROBOTIC ASSISTED INGUINAL HERNIA REPAIR WITH MESH Right 12/29/2020   Procedure: XI ROBOTIC ASSISTED INGUINAL HERNIA REPAIR WITH MESH;  Surgeon: Herbert Pun, MD;  Location: [facility] ORS;  Service: General;  Laterality: Right;    Current Outpatient Medications:    Ascorbic Acid (VITAMIN C) 1000 MG tablet, Take 1,000 mg by mouth daily. , Disp: , Rfl:    atorvastatin (LIPITOR) 20 MG tablet, Take 20 mg by mouth daily., Disp: , Rfl:    Calcium Carb-Cholecalciferol (CALCIUM 600 + D PO), Take 2 tablets by mouth every evening. , Disp: , Rfl:    cholecalciferol (VITAMIN D3) 25 MCG (1000  UNIT) tablet, Take 1,000 Units by mouth at bedtime., Disp: , Rfl:    clobetasol cream (TEMOVATE) 9.70 %, Apply 1 application topically 2 (two) times daily. Rash on arm, Disp: , Rfl:    donepezil (ARICEPT) 10 MG tablet, Take 10 mg by mouth at bedtime., Disp: , Rfl:    levothyroxine (SYNTHROID) 137 MCG tablet, Take 137 mcg by mouth daily before breakfast., Disp: , Rfl: 10   Multiple Vitamin (MULTIVITAMIN WITH MINERALS) TABS tablet, Take 1 tablet by mouth every evening.,  Disp: , Rfl:    Omega-3 Fatty Acids (FISH OIL) 1000 MG CPDR, Take 1,000 mg by mouth at bedtime., Disp: , Rfl:    pantoprazole (PROTONIX) 40 MG tablet, Take 40 mg by mouth daily., Disp: , Rfl:    polyvinyl alcohol (LIQUIFILM TEARS) 1.4 % ophthalmic solution, Place 1 drop into both eyes every 4 (four) hours as needed for dry eyes., Disp: , Rfl:    Saw Palmetto, Serenoa repens, 450 MG CAPS, Take 1,350 mg by mouth in the morning and at bedtime., Disp: , Rfl:    vitamin B-12 (CYANOCOBALAMIN) 1000 MCG tablet, Take 1,000 mcg by mouth daily., Disp: , Rfl:    vitamin E 180 MG (400 UNITS) capsule, Take 400 Units by mouth daily., Disp: , Rfl:     No family history on file.   Social History   Tobacco Use   Smoking status: Former    Packs/day: 2.00    Years: 40.00    Pack years: 80.00    Types: Cigarettes, Cigars    Quit date: 06/10/1997    Years since quitting: 24.2   Smokeless tobacco: Former  Scientific laboratory technician Use: Never used  Substance Use Topics   Alcohol use: No   Drug use: No    Allergies as of 09/01/2021 - Review Complete 09/01/2021  Allergen Reaction Noted   Codeine Other (See Comments) 02/13/2015    Review of Systems:    All systems reviewed and negative except where noted in HPI.   Physical Exam:  BP (!) 150/87    Pulse 62    Temp 98.1 F (36.7 C) (Oral)    Ht 5\' 5"  (1.651 m)    Wt 170 lb 9.6 oz (77.4 kg)    BMI 28.39 kg/m  No LMP for male patient.  General:   Alert,  Well-developed, well-nourished, pleasant and cooperative in NAD Head:  Normocephalic and atraumatic. Eyes:  Sclera clear, no icterus.   Conjunctiva pink. Ears:  Normal auditory acuity. Nose:  No deformity, discharge, or lesions. Mouth:  No deformity or lesions,oropharynx pink & moist. Neck:  Supple; no masses or thyromegaly. Lungs:  Respirations even and unlabored.  Clear throughout to auscultation.   No wheezes, crackles, or rhonchi. No acute distress. Heart:  Regular rate and rhythm; no murmurs,  clicks, rubs, or gallops. Abdomen:  Normal bowel sounds. Soft, non-tender and non-distended without masses, hepatosplenomegaly or hernias noted.  No guarding or rebound tenderness.   Rectal: Dried blood on the perianal skin.  Nontender digital rectal exam.  Anoscopy revealed large right posterior hemorrhoid and left lateral hemorrhoid Msk:  Symmetrical without gross deformities. Good, equal movement & strength bilaterally. Pulses:  Normal pulses noted. Extremities:  No clubbing or edema.  No cyanosis. Neurologic:  Alert and oriented x3;  grossly normal neurologically. Skin:  Intact without significant lesions or rashes. No jaundice. Psych:  Alert and cooperative. Normal mood and affect.  Imaging Studies: Reviewed  Assessment and Plan:   Dylan Chen  Dylan Chen is a 65 y.o. male with no significant past medical history is seen in consultation for painless rectal bleeding secondary to internal hemorrhoids.  Patient underwent colonoscopy in 11/22 which revealed large internal hemorrhoids and sigmoid diverticulosis.  Patient denies any constipation, has ongoing rectal bleeding  I have discussed regarding outpatient hemorrhoid ligation, procedure, risks and benefits, consent obtained.  Patient is agreeable to undergo hemorrhoid ligation   Follow up in 2 weeks   Cephas Darby, MD

## 2021-09-17 ENCOUNTER — Encounter: Payer: Self-pay | Admitting: Gastroenterology

## 2021-09-17 ENCOUNTER — Ambulatory Visit (INDEPENDENT_AMBULATORY_CARE_PROVIDER_SITE_OTHER): Payer: Medicare Other | Admitting: Gastroenterology

## 2021-09-17 VITALS — BP 152/77 | HR 59 | Temp 97.8°F | Ht 65.0 in | Wt 170.4 lb

## 2021-09-17 DIAGNOSIS — K625 Hemorrhage of anus and rectum: Secondary | ICD-10-CM

## 2021-09-17 DIAGNOSIS — K640 First degree hemorrhoids: Secondary | ICD-10-CM | POA: Diagnosis not present

## 2021-09-17 NOTE — Progress Notes (Signed)
PROCEDURE NOTE: The patient presents with symptomatic grade 1 hemorrhoids, unresponsive to maximal medical therapy, requesting rubber band ligation of his/her hemorrhoidal disease.  All risks, benefits and alternative forms of therapy were described and informed consent was obtained.  Patient reports that his rectal bleeding has significantly improved since first banding  The decision was made to band the LL internal hemorrhoid, and the [place] was used to perform band ligation without complication.  Digital anorectal examination was then performed to assure proper positioning of the band, and to adjust the banded tissue as required.  The patient was discharged home without pain or other issues.  Dietary and behavioral recommendations were given and (if necessary - prescriptions were given), along with follow-up instructions.  The patient will return 2 weeks for follow-up and possible additional banding as required.  No complications were encountered and the patient tolerated the procedure well.

## 2021-09-28 ENCOUNTER — Other Ambulatory Visit: Payer: Self-pay | Admitting: Surgery

## 2021-09-28 DIAGNOSIS — M7582 Other shoulder lesions, left shoulder: Secondary | ICD-10-CM

## 2021-09-28 DIAGNOSIS — S46012D Strain of muscle(s) and tendon(s) of the rotator cuff of left shoulder, subsequent encounter: Secondary | ICD-10-CM

## 2021-10-08 ENCOUNTER — Other Ambulatory Visit: Payer: Self-pay

## 2021-10-08 ENCOUNTER — Ambulatory Visit
Admission: RE | Admit: 2021-10-08 | Discharge: 2021-10-08 | Disposition: A | Discharge status: Home / Self Care | Payer: Medicare Other | Source: Ambulatory Visit | Attending: Surgery | Admitting: Surgery

## 2021-10-08 DIAGNOSIS — M7582 Other shoulder lesions, left shoulder: Secondary | ICD-10-CM | POA: Diagnosis present

## 2021-10-08 DIAGNOSIS — S46012D Strain of muscle(s) and tendon(s) of the rotator cuff of left shoulder, subsequent encounter: Secondary | ICD-10-CM | POA: Insufficient documentation

## 2021-10-21 ENCOUNTER — Other Ambulatory Visit: Payer: Self-pay | Admitting: Surgery

## 2021-10-28 ENCOUNTER — Encounter
Admission: RE | Admit: 2021-10-28 | Discharge: 2021-10-28 | Disposition: A | Discharge status: Home / Self Care | Payer: Medicare Other | Source: Ambulatory Visit | Attending: Surgery | Admitting: Surgery

## 2021-10-28 ENCOUNTER — Other Ambulatory Visit: Payer: Self-pay

## 2021-10-28 DIAGNOSIS — Z01812 Encounter for preprocedural laboratory examination: Secondary | ICD-10-CM

## 2021-10-28 DIAGNOSIS — Z01818 Encounter for other preprocedural examination: Secondary | ICD-10-CM | POA: Insufficient documentation

## 2021-10-28 LAB — CBC WITH DIFFERENTIAL/PLATELET
Abs Immature Granulocytes: 0.01 10*3/uL (ref 0.00–0.07)
Basophils Absolute: 0 10*3/uL (ref 0.0–0.1)
Basophils Relative: 1 %
Eosinophils Absolute: 0.1 10*3/uL (ref 0.0–0.5)
Eosinophils Relative: 2 %
HCT: 43.6 % (ref 39.0–52.0)
Hemoglobin: 14.8 g/dL (ref 13.0–17.0)
Immature Granulocytes: 0 %
Lymphocytes Relative: 30 %
Lymphs Abs: 1.2 10*3/uL (ref 0.7–4.0)
MCH: 32 pg (ref 26.0–34.0)
MCHC: 33.9 g/dL (ref 30.0–36.0)
MCV: 94.4 fL (ref 80.0–100.0)
Monocytes Absolute: 0.3 10*3/uL (ref 0.1–1.0)
Monocytes Relative: 6 %
Neutro Abs: 2.6 10*3/uL (ref 1.7–7.7)
Neutrophils Relative %: 61 %
Platelets: 217 10*3/uL (ref 150–400)
RBC: 4.62 MIL/uL (ref 4.22–5.81)
RDW: 11.9 % (ref 11.5–15.5)
WBC: 4.1 10*3/uL (ref 4.0–10.5)
nRBC: 0 % (ref 0.0–0.2)

## 2021-10-28 LAB — URINALYSIS, ROUTINE W REFLEX MICROSCOPIC
Bilirubin Urine: NEGATIVE
Glucose, UA: NEGATIVE mg/dL
Hgb urine dipstick: NEGATIVE
Ketones, ur: NEGATIVE mg/dL
Leukocytes,Ua: NEGATIVE
Nitrite: NEGATIVE
Protein, ur: NEGATIVE mg/dL
Specific Gravity, Urine: 1.016 (ref 1.005–1.030)
pH: 5 (ref 5.0–8.0)

## 2021-10-28 LAB — TYPE AND SCREEN
ABO/RH(D): B NEG
Antibody Screen: NEGATIVE

## 2021-10-28 LAB — COMPREHENSIVE METABOLIC PANEL
ALT: 28 U/L (ref 0–44)
AST: 30 U/L (ref 15–41)
Albumin: 4.6 g/dL (ref 3.5–5.0)
Alkaline Phosphatase: 83 U/L (ref 38–126)
Anion gap: 5 (ref 5–15)
BUN: 17 mg/dL (ref 8–23)
CO2: 29 mmol/L (ref 22–32)
Calcium: 9.5 mg/dL (ref 8.9–10.3)
Chloride: 106 mmol/L (ref 98–111)
Creatinine, Ser: 0.96 mg/dL (ref 0.61–1.24)
GFR, Estimated: >60 mL/min (ref >60)
Glucose, Bld: 96 mg/dL (ref 70–99)
Potassium: 3.4 mmol/L — ABNORMAL LOW (ref 3.5–5.1)
Sodium: 140 mmol/L (ref 135–145)
Total Bilirubin: 1.8 mg/dL — ABNORMAL HIGH (ref 0.3–1.2)
Total Protein: 7.2 g/dL (ref 6.5–8.1)

## 2021-10-28 LAB — SURGICAL PCR SCREEN
MRSA, PCR: NEGATIVE
Staphylococcus aureus: NEGATIVE

## 2021-10-28 NOTE — Patient Instructions (Signed)
Your procedure is scheduled on: Tuesday, February 28 Report to the Registration Desk on the 1st floor of the [facility]. To find out your arrival time, please call [phone] between 1PM - 3PM on: Monday, February 27  REMEMBER: Instructions that are not followed completely may result in serious medical risk, up to and including death; or upon the discretion of your surgeon and anesthesiologist your surgery may need to be rescheduled.  Do not eat food after midnight the night before surgery.  No gum chewing, lozengers or hard candies.  You may however, drink CLEAR liquids up to 2 hours before you are scheduled to arrive for your surgery. Do not drink anything within 2 hours of your scheduled arrival time.  Clear liquids include: - water  - apple juice without pulp - gatorade (not RED, PURPLE, OR BLUE) - black coffee or tea (Do NOT add milk or creamers to the coffee or tea) Do NOT drink anything that is not on this list.  In addition, your doctor has ordered for you to drink the provided  Ensure Pre-Surgery Clear Carbohydrate Drink  Drinking this carbohydrate drink up to two hours before surgery helps to reduce insulin resistance and improve patient outcomes. Please complete drinking 2 hours prior to scheduled arrival time.  TAKE THESE MEDICATIONS THE MORNING OF SURGERY WITH A SIP OF WATER:  Levothyroxine Pantoprazole - (take one the night before and one on the morning of surgery - helps to prevent nausea after surgery.)  One week prior to surgery: starting February 21 Stop Anti-inflammatories (NSAIDS) such as Advil, Aleve, Ibuprofen, Motrin, Naproxen, Naprosyn and Aspirin based products such as Excedrin, Goodys Powder, BC Powder. Stop ANY OVER THE COUNTER supplements until after surgery. Stop vitamin E, saw palmetto, fish oil, multiple vitamin, vitamin D, calcium, vitamin C You may however, continue to take Tylenol if needed for pain up until the day of surgery.  No Alcohol for  24 hours before or after surgery.  No Smoking including e-cigarettes for 24 hours prior to surgery.  No chewable tobacco products for at least 6 hours prior to surgery.  No nicotine patches on the day of surgery.  Do not use any "recreational" drugs for at least a week prior to your surgery.  Please be advised that the combination of cocaine and anesthesia may have negative outcomes, up to and including death. If you test positive for cocaine, your surgery will be cancelled.  On the morning of surgery brush your teeth with toothpaste and water, you may rinse your mouth with mouthwash if you wish. Do not swallow any toothpaste or mouthwash.  Use CHG Soap as directed on instruction sheet.  Do not wear jewelry, make-up, hairpins, clips or nail polish.  Do not wear lotions, powders, or perfumes.   Do not shave body from the neck down 48 hours prior to surgery just in case you cut yourself which could leave a site for infection.  Also, freshly shaved skin may become irritated if using the CHG soap.  Contact lenses, hearing aids and dentures may not be worn into surgery.  Do not bring valuables to the hospital. [facility] is not responsible for any missing/lost belongings or valuables.   Total Shoulder Arthroplasty:  use Benzolyl Peroxide 5% Gel as directed on instruction sheet.  Bring your C-PAP to the hospital with you in case you may have to spend the night.   Notify your doctor if there is any change in your medical condition (cold, fever, infection).  Wear comfortable clothing (specific to your surgery type) to the hospital.  After surgery, you can help prevent lung complications by doing breathing exercises.  Take deep breaths and cough every 1-2 hours. Your doctor may order a device called an Incentive Spirometer to help you take deep breaths.  If you are being admitted to the hospital overnight, leave your suitcase in the car. After surgery it may be brought to your  room.  If you are being discharged the day of surgery, you will not be allowed to drive home. You will need a responsible adult (18 years or older) to drive you home and stay with you that night.   If you are taking public transportation, you will need to have a responsible adult (18 years or older) with you. Please confirm with your physician that it is acceptable to use public transportation.   Please call the [facility] at [phone] if you have any questions about these instructions.  Surgery Visitation Policy:  Patients undergoing a surgery or procedure may have one family member or support person with them as long as that person is not COVID-19 positive or experiencing its symptoms.  That person may remain in the waiting area during the procedure and may rotate out with other people.  Inpatient Visitation:    Visiting hours are 7 a.m. to 8 p.m. Up to two visitors ages 16+ are allowed at one time in a patient room. The visitors may rotate out with other people during the day. Visitors must check out when they leave, or other visitors will not be allowed. One designated support person may remain overnight. The visitor must pass COVID-19 screenings, use hand sanitizer when entering and exiting the patients room and wear a mask at all times, including in the patients room. Patients must also wear a mask when staff or their visitor are in the room. Masking is required regardless of vaccination status.

## 2021-11-06 ENCOUNTER — Other Ambulatory Visit
Admission: RE | Admit: 2021-11-06 | Discharge: 2021-11-06 | Disposition: A | Discharge status: Home / Self Care | Payer: Medicare Other | Source: Ambulatory Visit | Attending: Surgery | Admitting: Surgery

## 2021-11-06 ENCOUNTER — Other Ambulatory Visit: Payer: Self-pay

## 2021-11-06 DIAGNOSIS — Z01812 Encounter for preprocedural laboratory examination: Secondary | ICD-10-CM | POA: Insufficient documentation

## 2021-11-06 DIAGNOSIS — Z20822 Contact with and (suspected) exposure to covid-19: Secondary | ICD-10-CM | POA: Insufficient documentation

## 2021-11-06 LAB — SARS CORONAVIRUS 2 (TAT 6-24 HRS): SARS Coronavirus 2: NEGATIVE

## 2021-11-09 ENCOUNTER — Ambulatory Visit: Payer: Medicare Other | Admitting: Gastroenterology

## 2021-11-09 MED ORDER — ORAL CARE MOUTH RINSE: 15.0000 mL | Freq: Once | OROMUCOSAL | Status: AC

## 2021-11-09 MED ORDER — CHLORHEXIDINE GLUCONATE 0.12 % MT SOLN: 15.0000 mL | Freq: Once | OROMUCOSAL | Status: AC

## 2021-11-09 MED ORDER — CEFAZOLIN SODIUM-DEXTROSE 2-4 GM/100ML-% IV SOLN
2.0000 g | INTRAVENOUS | Status: AC
  Administered 2021-11-10: 2 g via INTRAVENOUS

## 2021-11-09 MED ORDER — LACTATED RINGERS IV SOLN: INTRAVENOUS | Status: DC

## 2021-11-10 ENCOUNTER — Ambulatory Visit
Admission: RE | Admit: 2021-11-10 | Discharge: 2021-11-10 | Disposition: A | Discharge status: Home / Self Care | Payer: Medicare Other | Attending: Surgery | Admitting: Surgery

## 2021-11-10 ENCOUNTER — Inpatient Hospital Stay: Payer: Medicare Other | Admitting: Urgent Care

## 2021-11-10 ENCOUNTER — Inpatient Hospital Stay: Payer: Medicare Other | Admitting: Anesthesiology

## 2021-11-10 ENCOUNTER — Inpatient Hospital Stay: Payer: Medicare Other

## 2021-11-10 ENCOUNTER — Other Ambulatory Visit: Payer: Self-pay

## 2021-11-10 ENCOUNTER — Encounter: Payer: Self-pay | Admitting: Surgery

## 2021-11-10 ENCOUNTER — Encounter
Admission: RE | Disposition: A | Discharge status: Home / Self Care | Payer: Self-pay | Source: Home / Self Care | Attending: Surgery

## 2021-11-10 DIAGNOSIS — S46012D Strain of muscle(s) and tendon(s) of the rotator cuff of left shoulder, subsequent encounter: Secondary | ICD-10-CM | POA: Diagnosis not present

## 2021-11-10 DIAGNOSIS — E890 Postprocedural hypothyroidism: Secondary | ICD-10-CM | POA: Diagnosis not present

## 2021-11-10 DIAGNOSIS — W19XXXD Unspecified fall, subsequent encounter: Secondary | ICD-10-CM | POA: Insufficient documentation

## 2021-11-10 DIAGNOSIS — Z96612 Presence of left artificial shoulder joint: Secondary | ICD-10-CM

## 2021-11-10 DIAGNOSIS — R2681 Unsteadiness on feet: Secondary | ICD-10-CM | POA: Diagnosis not present

## 2021-11-10 DIAGNOSIS — I10 Essential (primary) hypertension: Secondary | ICD-10-CM | POA: Insufficient documentation

## 2021-11-10 DIAGNOSIS — Z8711 Personal history of peptic ulcer disease: Secondary | ICD-10-CM | POA: Diagnosis not present

## 2021-11-10 DIAGNOSIS — Z87891 Personal history of nicotine dependence: Secondary | ICD-10-CM | POA: Insufficient documentation

## 2021-11-10 DIAGNOSIS — G4730 Sleep apnea, unspecified: Secondary | ICD-10-CM | POA: Diagnosis not present

## 2021-11-10 DIAGNOSIS — Z419 Encounter for procedure for purposes other than remedying health state, unspecified: Secondary | ICD-10-CM

## 2021-11-10 DIAGNOSIS — K219 Gastro-esophageal reflux disease without esophagitis: Secondary | ICD-10-CM | POA: Insufficient documentation

## 2021-11-10 DIAGNOSIS — F0390 Unspecified dementia without behavioral disturbance: Secondary | ICD-10-CM | POA: Insufficient documentation

## 2021-11-10 DIAGNOSIS — Z01812 Encounter for preprocedural laboratory examination: Secondary | ICD-10-CM

## 2021-11-10 DIAGNOSIS — M19012 Primary osteoarthritis, left shoulder: Secondary | ICD-10-CM | POA: Diagnosis not present

## 2021-11-10 HISTORY — PX: REVERSE SHOULDER ARTHROPLASTY: SHX5054

## 2021-11-10 SURGERY — ARTHROPLASTY, SHOULDER, TOTAL, REVERSE
Anesthesia: Regional | Site: Shoulder | Laterality: Left

## 2021-11-10 MED ORDER — BUPIVACAINE HCL (PF) 0.5 % IJ SOLN
INTRAMUSCULAR | Status: DC | PRN
  Administered 2021-11-10: 10 mL

## 2021-11-10 MED ORDER — LIDOCAINE HCL (CARDIAC) PF 100 MG/5ML IV SOSY
PREFILLED_SYRINGE | INTRAVENOUS | Status: DC | PRN
Start: 2021-11-10 — End: 2021-11-10
  Administered 2021-11-10: 20 mg via INTRAVENOUS

## 2021-11-10 MED ORDER — TRANEXAMIC ACID 1000 MG/10ML IV SOLN
INTRAVENOUS | Status: DC | PRN
  Administered 2021-11-10: 1000 mg via TOPICAL

## 2021-11-10 MED ORDER — MIDAZOLAM HCL 2 MG/2ML IJ SOLN
INTRAMUSCULAR | Status: AC
  Filled 2021-11-10: qty 2

## 2021-11-10 MED ORDER — ONDANSETRON HCL 4 MG/2ML IJ SOLN: 4.0000 mg | Freq: Once | INTRAMUSCULAR | Status: DC | PRN

## 2021-11-10 MED ORDER — BUPIVACAINE-EPINEPHRINE (PF) 0.5% -1:200000 IJ SOLN
INTRAMUSCULAR | Status: DC | PRN
  Administered 2021-11-10: 30 mL

## 2021-11-10 MED ORDER — PROPOFOL 10 MG/ML IV BOLUS
INTRAVENOUS | Status: DC | PRN
  Administered 2021-11-10: 150 mg via INTRAVENOUS
  Administered 2021-11-10: 20 mg via INTRAVENOUS

## 2021-11-10 MED ORDER — SUCCINYLCHOLINE CHLORIDE 200 MG/10ML IV SOSY
PREFILLED_SYRINGE | INTRAVENOUS | Status: DC | PRN
  Administered 2021-11-10: 100 mg via INTRAVENOUS

## 2021-11-10 MED ORDER — CEFAZOLIN SODIUM-DEXTROSE 2-4 GM/100ML-% IV SOLN: 2.0000 g | Freq: Four times a day (QID) | INTRAVENOUS | Status: DC

## 2021-11-10 MED ORDER — CEFAZOLIN SODIUM-DEXTROSE 2-4 GM/100ML-% IV SOLN
INTRAVENOUS | Status: AC
  Administered 2021-11-10: 2 g via INTRAVENOUS
  Filled 2021-11-10: qty 100

## 2021-11-10 MED ORDER — ACETAMINOPHEN 10 MG/ML IV SOLN
INTRAVENOUS | Status: AC
  Filled 2021-11-10: qty 100

## 2021-11-10 MED ORDER — SODIUM CHLORIDE 0.9 % IR SOLN
Status: DC | PRN
Start: 2021-11-10 — End: 2021-11-10
  Administered 2021-11-10: 3000 mL

## 2021-11-10 MED ORDER — BUPIVACAINE LIPOSOME 1.3 % IJ SUSP
INTRAMUSCULAR | Status: DC | PRN
  Administered 2021-11-10: 20 mL

## 2021-11-10 MED ORDER — TRANEXAMIC ACID 1000 MG/10ML IV SOLN
INTRAVENOUS | Status: AC
  Filled 2021-11-10: qty 10

## 2021-11-10 MED ORDER — PHENYLEPHRINE HCL-NACL 20-0.9 MG/250ML-% IV SOLN
INTRAVENOUS | Status: DC | PRN
  Administered 2021-11-10: 25 ug/min via INTRAVENOUS

## 2021-11-10 MED ORDER — PHENYLEPHRINE 40 MCG/ML (10ML) SYRINGE FOR IV PUSH (FOR BLOOD PRESSURE SUPPORT)
PREFILLED_SYRINGE | INTRAVENOUS | Status: DC | PRN
Start: 2021-11-10 — End: 2021-11-10
  Administered 2021-11-10 (×4): 80 ug via INTRAVENOUS

## 2021-11-10 MED ORDER — CHLORHEXIDINE GLUCONATE 0.12 % MT SOLN
OROMUCOSAL | Status: AC
  Administered 2021-11-10: 15 mL via OROMUCOSAL
  Filled 2021-11-10: qty 15

## 2021-11-10 MED ORDER — FENTANYL CITRATE (PF) 100 MCG/2ML IJ SOLN
INTRAMUSCULAR | Status: AC
  Filled 2021-11-10: qty 2

## 2021-11-10 MED ORDER — DEXMEDETOMIDINE (PRECEDEX) IN NS 20 MCG/5ML (4 MCG/ML) IV SYRINGE
PREFILLED_SYRINGE | INTRAVENOUS | Status: DC | PRN
  Administered 2021-11-10: 8 ug via INTRAVENOUS
  Administered 2021-11-10: 4 ug via INTRAVENOUS
  Administered 2021-11-10: 8 ug via INTRAVENOUS

## 2021-11-10 MED ORDER — ESMOLOL HCL 100 MG/10ML IV SOLN
INTRAVENOUS | Status: DC | PRN
  Administered 2021-11-10: 10 mg via INTRAVENOUS

## 2021-11-10 MED ORDER — FENTANYL CITRATE (PF) 100 MCG/2ML IJ SOLN
INTRAMUSCULAR | Status: DC | PRN
  Administered 2021-11-10 (×2): 50 ug via INTRAVENOUS

## 2021-11-10 MED ORDER — OXYCODONE HCL 5 MG PO TABS
ORAL_TABLET | ORAL | Status: AC
  Filled 2021-11-10: qty 1

## 2021-11-10 MED ORDER — FENTANYL CITRATE PF 50 MCG/ML IJ SOSY: 50.0000 ug | PREFILLED_SYRINGE | INTRAMUSCULAR | Status: DC | PRN

## 2021-11-10 MED ORDER — DEXAMETHASONE SODIUM PHOSPHATE 10 MG/ML IJ SOLN
INTRAMUSCULAR | Status: DC | PRN
Start: 2021-11-10 — End: 2021-11-10
  Administered 2021-11-10: 10 mg via INTRAVENOUS

## 2021-11-10 MED ORDER — FENTANYL CITRATE PF 50 MCG/ML IJ SOSY
PREFILLED_SYRINGE | INTRAMUSCULAR | Status: AC
  Administered 2021-11-10: 50 ug via INTRAVENOUS
  Filled 2021-11-10: qty 2

## 2021-11-10 MED ORDER — ACETAMINOPHEN 10 MG/ML IV SOLN: 1000.0000 mg | Freq: Once | INTRAVENOUS | Status: DC | PRN

## 2021-11-10 MED ORDER — KETOROLAC TROMETHAMINE 30 MG/ML IJ SOLN: 7.5000 mg | Freq: Four times a day (QID) | INTRAMUSCULAR | Status: DC

## 2021-11-10 MED ORDER — GLYCOPYRROLATE 0.2 MG/ML IJ SOLN
INTRAMUSCULAR | Status: DC | PRN
  Administered 2021-11-10: .2 mg via INTRAVENOUS

## 2021-11-10 MED ORDER — ONDANSETRON HCL 4 MG/2ML IJ SOLN
INTRAMUSCULAR | Status: DC | PRN
  Administered 2021-11-10 (×2): 4 mg via INTRAVENOUS

## 2021-11-10 MED ORDER — STERILE WATER FOR IRRIGATION IR SOLN
Status: DC | PRN
  Administered 2021-11-10: 1000 mL

## 2021-11-10 MED ORDER — LACTATED RINGERS IV SOLN: INTRAVENOUS | Status: DC

## 2021-11-10 MED ORDER — EPHEDRINE SULFATE (PRESSORS) 50 MG/ML IJ SOLN
INTRAMUSCULAR | Status: DC | PRN
  Administered 2021-11-10 (×3): 5 mg via INTRAVENOUS

## 2021-11-10 MED ORDER — BUPIVACAINE LIPOSOME 1.3 % IJ SUSP
INTRAMUSCULAR | Status: AC
  Filled 2021-11-10: qty 20

## 2021-11-10 MED ORDER — FENTANYL CITRATE (PF) 100 MCG/2ML IJ SOLN: 25.0000 ug | INTRAMUSCULAR | Status: DC | PRN

## 2021-11-10 MED ORDER — ACETAMINOPHEN 10 MG/ML IV SOLN
INTRAVENOUS | Status: DC | PRN
  Administered 2021-11-10: 1000 mg via INTRAVENOUS

## 2021-11-10 MED ORDER — CEFAZOLIN SODIUM-DEXTROSE 2-4 GM/100ML-% IV SOLN
INTRAVENOUS | Status: AC
  Filled 2021-11-10: qty 100

## 2021-11-10 MED ORDER — SUGAMMADEX SODIUM 200 MG/2ML IV SOLN
INTRAVENOUS | Status: DC | PRN
Start: 2021-11-10 — End: 2021-11-10
  Administered 2021-11-10: 200 mg via INTRAVENOUS

## 2021-11-10 MED ORDER — BUPIVACAINE HCL (PF) 0.5 % IJ SOLN
INTRAMUSCULAR | Status: AC
  Filled 2021-11-10: qty 10

## 2021-11-10 MED ORDER — BUPIVACAINE-EPINEPHRINE (PF) 0.5% -1:200000 IJ SOLN
INTRAMUSCULAR | Status: AC
Start: 2021-11-10 — End: ?
  Filled 2021-11-10: qty 30

## 2021-11-10 MED ORDER — 0.9 % SODIUM CHLORIDE (POUR BTL) OPTIME
TOPICAL | Status: DC | PRN
Start: 2021-11-10 — End: 2021-11-10
  Administered 2021-11-10: 500 mL

## 2021-11-10 MED ORDER — ROCURONIUM BROMIDE 100 MG/10ML IV SOLN
INTRAVENOUS | Status: DC | PRN
  Administered 2021-11-10 (×2): 10 mg via INTRAVENOUS
  Administered 2021-11-10: 40 mg via INTRAVENOUS

## 2021-11-10 MED ORDER — OXYCODONE HCL 5 MG PO TABS
5.0000 mg | ORAL_TABLET | Freq: Once | ORAL | Status: AC | PRN
  Administered 2021-11-10: 5 mg via ORAL

## 2021-11-10 MED ORDER — OXYCODONE HCL 5 MG/5ML PO SOLN: 5.0000 mg | Freq: Once | ORAL | Status: AC | PRN

## 2021-11-10 MED ORDER — KETOROLAC TROMETHAMINE 15 MG/ML IJ SOLN: 15.0000 mg | Freq: Once | INTRAMUSCULAR | Status: AC

## 2021-11-10 MED ORDER — OXYCODONE HCL 5 MG PO TABS: 5.0000 mg | ORAL_TABLET | ORAL | 0 refills | Status: AC | PRN

## 2021-11-10 MED ORDER — KETOROLAC TROMETHAMINE 15 MG/ML IJ SOLN
INTRAMUSCULAR | Status: AC
Start: 2021-11-10 — End: 2021-11-10
  Administered 2021-11-10: 15 mg via INTRAVENOUS
  Filled 2021-11-10: qty 1

## 2021-11-10 SURGICAL SUPPLY — 73 items
APL PRP STRL LF DISP 70% ISPRP (MISCELLANEOUS) ×2
BAG DECANTER FOR FLEXI CONT (MISCELLANEOUS) ×1 IMPLANT
BASEPLATE GLENOSPHERE 25 (Plate) ×1 IMPLANT
BEARING CROSSLINK RSA 36 (Joint) ×1 IMPLANT
BIT DRILL TWIST 2.7 (BIT) ×1 IMPLANT
BLADE SAGITTAL WIDE XTHICK NO (BLADE) ×2 IMPLANT
BLADE SAW SAG 25X90X1.19 (BLADE) ×1 IMPLANT
BOWL CEMENT MIX W/ADAPTER (MISCELLANEOUS) ×1 IMPLANT
BRNG HUM STD 36 RVRS SHDR PRLG (Joint) ×1 IMPLANT
CEMENT BONE R 1X40 (Cement) ×1 IMPLANT
CHLORAPREP W/TINT 26 (MISCELLANEOUS) ×4 IMPLANT
COOLER POLAR GLACIER W/PUMP (MISCELLANEOUS) ×3 IMPLANT
COVER BACK TABLE REUSABLE LG (DRAPES) ×3 IMPLANT
DRAPE 3/4 80X56 (DRAPES) ×4 IMPLANT
DRAPE INCISE IOBAN 66X45 STRL (DRAPES) ×4 IMPLANT
DRSG OPSITE POSTOP 4X8 (GAUZE/BANDAGES/DRESSINGS) ×3 IMPLANT
ELECT BLADE 6.5 EXT (BLADE) ×2 IMPLANT
ELECT CAUTERY BLADE 6.4 (BLADE) ×3 IMPLANT
ELECT REM PT RETURN 9FT ADLT (ELECTROSURGICAL) ×2
ELECTRODE REM PT RTRN 9FT ADLT (ELECTROSURGICAL) ×2 IMPLANT
GAUZE PACK 2X3YD (PACKING) ×1 IMPLANT
GAUZE XEROFORM 1X8 LF (GAUZE/BANDAGES/DRESSINGS) ×2 IMPLANT
GLENOID SPHERE 36MM CVD +3 (Orthopedic Implant) ×1 IMPLANT
GLOVE SRG 8 PF TXTR STRL LF DI (GLOVE) ×3 IMPLANT
GLOVE SURG ENC MOIS LTX SZ7.5 (GLOVE) ×11 IMPLANT
GLOVE SURG ENC MOIS LTX SZ8 (GLOVE) ×11 IMPLANT
GLOVE SURG UNDER LTX SZ8 (GLOVE) ×3 IMPLANT
GLOVE SURG UNDER POLY LF SZ8 (GLOVE) ×2
GOWN STRL REUS W/ TWL LRG LVL3 (GOWN DISPOSABLE) ×2 IMPLANT
GOWN STRL REUS W/ TWL XL LVL3 (GOWN DISPOSABLE) ×2 IMPLANT
GOWN STRL REUS W/TWL LRG LVL3 (GOWN DISPOSABLE) ×2
GOWN STRL REUS W/TWL XL LVL3 (GOWN DISPOSABLE) ×4
IV NS 100ML SINGLE PACK (IV SOLUTION) ×1 IMPLANT
IV NS IRRIG 3000ML ARTHROMATIC (IV SOLUTION) ×3 IMPLANT
KIT STABILIZATION SHOULDER (MISCELLANEOUS) ×3 IMPLANT
KIT TURNOVER KIT A (KITS) ×2 IMPLANT
MANIFOLD NEPTUNE II (INSTRUMENTS) ×3 IMPLANT
MASK FACE SPIDER DISP (MASK) ×3 IMPLANT
MAT ABSORB  FLUID 56X50 GRAY (MISCELLANEOUS) ×1
MAT ABSORB FLUID 56X50 GRAY (MISCELLANEOUS) ×2 IMPLANT
NDL SAFETY ECLIPSE 18X1.5 (NEEDLE) ×2 IMPLANT
NDL SPNL 20GX3.5 QUINCKE YW (NEEDLE) ×2 IMPLANT
NEEDLE HYPO 18GX1.5 SHARP (NEEDLE) ×2
NEEDLE SPNL 20GX3.5 QUINCKE YW (NEEDLE) ×2 IMPLANT
NS IRRIG 500ML POUR BTL (IV SOLUTION) ×3 IMPLANT
PACK ARTHROSCOPY SHOULDER (MISCELLANEOUS) ×3 IMPLANT
PAD ARMBOARD 7.5X6 YLW CONV (MISCELLANEOUS) ×2 IMPLANT
PAD WRAPON POLAR SHDR UNIV (MISCELLANEOUS) ×2 IMPLANT
PIN THREADED REVERSE (PIN) ×1 IMPLANT
PULSAVAC PLUS IRRIG FAN TIP (DISPOSABLE) ×2
SCREW BONE CORT 6.5X35MM (Screw) ×1 IMPLANT
SCREW BONE LOCKING 4.75X35X3.5 (Screw) ×1 IMPLANT
SCREW LOCKING NS 4.75MMX20MM (Screw) ×3 IMPLANT
SLING ULTRA II M (MISCELLANEOUS) ×2 IMPLANT
SPONGE T-LAP 18X18 ~~LOC~~+RFID (SPONGE) ×6 IMPLANT
STAPLER SKIN PROX 35W (STAPLE) ×3 IMPLANT
STEM HUM MICRO SZ 9 (Stem) ×1 IMPLANT
STRAP SAFETY 5IN WIDE (MISCELLANEOUS) ×2 IMPLANT
SUT ETHIBOND 0 MO6 C/R (SUTURE) ×3 IMPLANT
SUT FIBERWIRE #2 38 BLUE 1/2 (SUTURE) ×4
SUT VIC AB 0 CT1 36 (SUTURE) ×4 IMPLANT
SUT VIC AB 2-0 CT1 27 (SUTURE) ×4
SUT VIC AB 2-0 CT1 TAPERPNT 27 (SUTURE) ×4 IMPLANT
SUTURE FIBERWR #2 38 BLUE 1/2 (SUTURE) ×6 IMPLANT
SYR 10ML LL (SYRINGE) ×3 IMPLANT
SYR 30ML LL (SYRINGE) ×4 IMPLANT
SYR TOOMEY IRRIG 70ML (MISCELLANEOUS)
SYRINGE TOOMEY IRRIG 70ML (MISCELLANEOUS) ×1 IMPLANT
TAPE TRANSPORE STRL 2 31045 (GAUZE/BANDAGES/DRESSINGS) ×1 IMPLANT
TIP FAN IRRIG PULSAVAC PLUS (DISPOSABLE) ×2 IMPLANT
TRAY HUM NEUTRAL STD -0 (Shoulder) ×1 IMPLANT
WATER STERILE IRR 500ML POUR (IV SOLUTION) ×2 IMPLANT
WRAPON POLAR PAD SHDR UNIV (MISCELLANEOUS) ×2

## 2021-11-10 NOTE — Transfer of Care (Signed)
Immediate Anesthesia Transfer of Care Note  Patient: Dylan Chen  Procedure(s) Performed: REVERSE SHOULDER ARTHROPLASTY (Left: Shoulder)  Patient Location: PACU  Anesthesia Type:General  Level of Consciousness: awake, drowsy and patient cooperative  Airway & Oxygen Therapy: Patient Spontanous Breathing and Patient connected to face mask oxygen  Post-op Assessment: Report given to RN and Post -op Vital signs reviewed and stable  Post vital signs: Reviewed and stable  Last Vitals:  Vitals Value Taken Time  BP 112/67 11/10/21 1243  Temp    Pulse 88 11/10/21 1244  Resp 22 11/10/21 1244  SpO2 99 % 11/10/21 1244  Vitals shown include unvalidated device data.  Last Pain:  Vitals:   11/10/21 0912  TempSrc: Temporal  PainSc: 0-No pain         Complications: No notable events documented.

## 2021-11-10 NOTE — Discharge Instructions (Addendum)
Orthopedic discharge instructions: May shower with intact OpSite dressing once nerve block has worn off. Apply ice frequently to shoulder or use Polar Care device. Take ibuprofen 600-800 mg TID with meals for 7-10 days, then as necessary. Take oxycodone as prescribed when needed.  May supplement with ES Tylenol if necessary. Keep shoulder immobilizer on at all times except may remove for bathing purposes. Follow-up in 10-14 days or as scheduled.AMBULATORY SURGERY   DISCHARGE INSTRUCTIONS   The drugs that you were given will stay in your system until tomorrow so for the next 24 hours you should not:  Drive an automobile Make any legal decisions Drink any alcoholic beverage   You may resume regular meals tomorrow.  Today it is better to start with liquids and gradually work up to solid foods.  You may eat anything you prefer, but it is better to start with liquids, then soup and crackers, and gradually work up to solid foods.   Please notify your doctor immediately if you have any unusual bleeding, trouble breathing, redness and pain at the surgery site, drainage, fever, or pain not relieved by medication.    Your post-operative visit with Dr.                                       is: Date:                        Time:    Please call to schedule your post-operative visit.  Additional Instructions:

## 2021-11-10 NOTE — Anesthesia Procedure Notes (Signed)
Procedure Name: Intubation Date/Time: 11/10/2021 10:18 AM Performed by: Kelton Pillar, CRNA Pre-anesthesia Checklist: Patient identified, Emergency Drugs available, Suction available and Patient being monitored Patient Re-evaluated:Patient Re-evaluated prior to induction Oxygen Delivery Method: Circle system utilized Preoxygenation: Pre-oxygenation with 100% oxygen Induction Type: IV induction Ventilation: Mask ventilation without difficulty Laryngoscope Size: McGraph and 3 Grade View: Grade I Tube type: Oral Tube size: 7.0 mm Number of attempts: 1 Airway Equipment and Method: Stylet and Oral airway Placement Confirmation: ETT inserted through vocal cords under direct vision, positive ETCO2, breath sounds checked- equal and bilateral and CO2 detector Secured at: 21 cm Tube secured with: Tape Dental Injury: Teeth and Oropharynx as per pre-operative assessment

## 2021-11-10 NOTE — Anesthesia Procedure Notes (Signed)
Anesthesia Regional Block: Interscalene brachial plexus block   Pre-Anesthetic Checklist: , timeout performed,  Correct Patient, Correct Site, Correct Laterality,  Correct Procedure,, site marked,  Risks and benefits discussed,  Surgical consent,  Pre-op evaluation,  At surgeon's request and post-op pain management  Laterality: Left  Prep: chloraprep       Needles:  Injection technique: Single-shot  Needle Type: Echogenic Needle          Additional Needles:   Procedures:,,,, ultrasound used (permanent image in chart),,   Motor weakness within 20 minutes.  Narrative:  Start time: 11/10/2021 9:33 AM End time: 11/10/2021 9:35 AM Injection made incrementally with aspirations every 5 mL.  Performed by: Personally  Anesthesiologist: Darrin Nipper, MD  Additional Notes: Functioning IV was confirmed and monitors applied.  Sterile prep and drape, hand hygiene and sterile gloves were used. Ultrasound guidance: relevant anatomy identified, needle position confirmed, local anesthetic spread visualized around nerve(s), vascular puncture avoided.  Image saved to electronic medical record.  Negative aspiration prior to incremental administration of local anesthetic for total 20 ml Exparel and 10 ml bupivacaine 0.5% given in interscalene distribution. The patient tolerated the procedure well. Vital signs and moderate sedation medications recorded in RN notes.

## 2021-11-10 NOTE — Anesthesia Preprocedure Evaluation (Addendum)
Anesthesia Evaluation  Patient identified by MRN, date of birth, ID band Patient awake    Reviewed: Allergy & Precautions, NPO status , Patient's Chart, lab work & pertinent test results  History of Anesthesia Complications Negative for: history of anesthetic complications  Airway Mallampati: III   Neck ROM: Full    Dental   Missing teeth x5, some chipped/broken:   Pulmonary sleep apnea and Continuous Positive Airway Pressure Ventilation , former smoker (quit 1998),    Pulmonary exam normal breath sounds clear to auscultation       Cardiovascular hypertension, Normal cardiovascular exam Rhythm:Regular Rate:Normal  ECG 10/28/21:  Sinus bradycardia Low voltage QRS T wave abnormality, consider lateral ischemia   Neuro/Psych PSYCHIATRIC DISORDERS Dementia negative neurological ROS     GI/Hepatic hiatal hernia, PUD, GERD  ,  Endo/Other  Hypothyroidism (thyroid CA s/p thyroidectomy)   Renal/GU negative Renal ROS     Musculoskeletal  (+) Arthritis ,   Abdominal   Peds  Hematology negative hematology ROS (+)   Anesthesia Other Findings   Reproductive/Obstetrics                            Anesthesia Physical Anesthesia Plan  ASA: 2  Anesthesia Plan: General and Regional   Post-op Pain Management: Regional block*   Induction: Intravenous  PONV Risk Score and Plan: 2 and Ondansetron, Dexamethasone and Treatment may vary due to age or medical condition  Airway Management Planned: Oral ETT  Additional Equipment:   Intra-op Plan:   Post-operative Plan: Extubation in OR  Informed Consent: I have reviewed the patients History and Physical, chart, labs and discussed the procedure including the risks, benefits and alternatives for the proposed anesthesia with the patient or authorized representative who has indicated his/her understanding and acceptance.     Dental advisory given  Plan  Discussed with: CRNA  Anesthesia Plan Comments: (Plan for preoperative interscalene nerve block and GETA.  Patient consented for risks of anesthesia including but not limited to:  - adverse reactions to medications - damage to eyes, teeth, lips or other oral mucosa - nerve damage due to positioning  - sore throat or hoarseness - damage to heart, brain, nerves, lungs, other parts of body or loss of life  Informed patient about role of CRNA in peri- and intra-operative care.  Patient voiced understanding.)        Anesthesia Quick Evaluation

## 2021-11-10 NOTE — H&P (Signed)
History of Present Illness: Dylan Chen is a 66 y.o. male who presents today for his surgical history and physical for upcoming left reverse total shoulder arthroplasty. The patient is status post a left shoulder arthroscopy with underlying rotator cuff repair. The patient suffered a fall and landed on his left shoulder, subsequent MRI scan demonstrated repeat tearing of the rotator cuff with significant retraction. Because of this the patient and wife were instructed on the risk and benefits of a reverse total shoulder arthroplasty and wished to proceed with this instead of attempting a repeat rotator cuff repair. The patient denies any changes in his medical history since he was last evaluated. He denies any numbness or tingling to the left upper extremity today's appointment. Pain score today is a 1 out of 10, he denies any significant pain however he still has significant limitations with activities with the left arm. The patient denies any personal history of heart attack, stroke, asthma or COPD. No personal history of blood clots.  Past Medical History:  Arthritis   GERD (gastroesophageal reflux disease)   Hyperlipidemia   Hypertension   Peptic ulcer   Post-surgical hypothyroidism   Sleep apnea (on CPAP)   Thyroid cancer (CMS-HCC) 2009   Past Surgical History:  hiatus hernia repair 1975   COLONOSCOPY 01/14/2005 (Dr. Ivor Messier @ [facility], FHPolyps(m), rpt 5 yrs per provider, 3 ltrs mailed)   THYROIDECTOMY TOTAL 2009 (Surgeon: Dr. Ayesha Mohair, [facility])   Left Total hip Replacement 2010   shoulder pain Right 2012 (surgery [facility])   HERNIA REPAIR Left 06/16/2017 (Dr Rochel Brome)   COLONOSCOPY 12/19/2017 [facility] (Mother) [facility] 12/2022)   INGUINAL HERNIA REPAIR Right 12/29/2020 (Dr Peyton Najjar - [facility]   Extensive arthroscopic debridement, arthroscopic subacromial decompression, and mini-open rotator cuff repair, left shoulder. Left 06/25/2021 (Dr. Roland Rack)   UPPER GASTROINTESTINAL ENDOSCOPY   Past  Family History:  Pancreatic cancer Mother   Thyroid cancer Mother 92   Colon polyps Mother   Lung cancer Father   Diabetes type II Maternal Grandmother   Medications:  acetaminophen (TYLENOL) 500 mg capsule Take 1,000 mg by mouth as needed for Pain   ascorbic acid (VITAMIN C) 500 MG tablet Take 1,000 mg by mouth once daily   atorvastatin (LIPITOR) 20 MG tablet TAKE 1 TABLET BY MOUTH EVERY DAY 90 tablet 3   CALCIUM CARBONATE/VITAMIN D3 (CALCIUM 600 + D,3, ORAL) Take 500 mg by mouth once daily   cholecalciferol (VITAMIN D3) 1000 unit tablet Take by mouth once daily   clotrimazole-betamethasone (LOTRISONE) 1-0.05 % cream Apply topically 2 (two) times daily 45 g 0   cyanocobalamin (VITAMIN B12) 1000 MCG tablet Take by mouth once daily   docosahexanoic acid/epa (FISH OIL ORAL) Take 1,200 mg by mouth once daily   donepeziL (ARICEPT) 10 MG tablet Take 1 tablet (10 mg total) by mouth nightly 90 tablet 3   levothyroxine (SYNTHROID) 137 MCG tablet Take 1 tablet (137 mcg total) by mouth once daily Take on an empty stomach with a glass of water at least 30-60 minutes before breakfast. 90 tablet 3   multivitamin capsule Take 1 capsule by mouth once daily   pantoprazole (PROTONIX) 40 MG DR tablet TAKE 1 TABLET BY MOUTH EVERY DAY 90 tablet 3   saw palmetto 500 MG capsule Take 900 mg by mouth 2 (two) times daily   vitamin E acetate (VITAMIN E ORAL) Take 1 tablet by mouth once daily   Allergies:  Codeine Other (Makes feel weird)   Review  of Systems:  A comprehensive 14 point ROS was performed, reviewed by me today, and the pertinent orthopaedic findings are documented in the HPI.  Physical Exam: BP (!) 142/90   Ht 165.1 cm (5\' 5" )   Wt 77.4 kg (170 lb 9.6 oz)   BMI 28.39 kg/m  General/Constitutional: The patient appears to be well-nourished, well-developed, and in no acute distress. Neuro/Psych: Normal mood and affect, oriented to person, place and time. Eyes: Non-icteric. Pupils are equal, round,  and reactive to light, and exhibit synchronous movement. ENT: Unremarkable. Lymphatic: No palpable adenopathy. Respiratory: Lungs clear to auscultation, Normal chest excursion, No wheezes and Non-labored breathing Cardiovascular: Regular rate and rhythm. No murmurs. and No edema, swelling or tenderness, except as noted in detailed exam. Integumentary: No impressive skin lesions present, except as noted in detailed exam. Musculoskeletal: Unremarkable, except as noted in detailed exam.  Left shoulder exam: On inspection, his surgical incision and arthroscopic portal sites are well-healed and without evidence for infection.  No swelling, erythema, ecchymosis, abrasions, or other skin abnormalities are identified.  He has mild-moderate focal tenderness to palpation over the anterior aspect of the shoulder.  Passively, he can tolerate forward flexion to 90 degrees and abduction to 90 degrees.  At 90 degrees of abduction, he is able to tolerate external rotation to 35 degrees and internal rotation to 40 degrees.  He experiences moderate pain with all motions.  He demonstrates 3+/5 strength with gentle resisted internal and external rotation, and 3/5 strength with gentle resisted abduction with his arm at his side. He experiences moderate pain with attempted resisted abduction and mild pain with resisted external rotation. He remains neurovascularly intact to the left upper extremity and hand.  MRI OF THE LEFT SHOULDER WITHOUT CONTRAST:  1. Motion artifact technically limits this examination.  2. Interval superior rotator cuff repair. Worsened full-thickness  tear near the entire tendon footprint of the supraspinatus tendon  with retraction to the superior aspect of the glenoid.  3. Mild extension of the full-thickness tear into the anterior  aspect of the infraspinatus tendon footprint.  4. Worsened full-thickness tear of the superior 50% of the  subscapularis tendon insertion.  5. Moderate  supraspinatus muscle atrophy.  6. Unchanged proximal long head of the biceps tendon rupture with  distal tendon retraction.  7. Mild-to-moderate degenerative changes of the acromioclavicular  joint.   Impression: Recurrent traumatic massive rotator cuff tear, left shoulder.  Plan:  1. Treatment options were discussed today with the patient. 2. The patient is scheduled for a left reverse total shoulder arthroplasty with Dr. Roland Rack on 11/10/2021. 3. The patient was instructed on the risk and benefits of a reverse total shoulder arthroplasty at today's visit. Both the patient and his wife verbalized their understanding. 4. This document will serve as a history and physical for the patient. 5. The patient will follow-up per standard postop protocol. They can call the clinic they have any questions, new symptoms develop or symptoms worsen.  The procedure was discussed with the patient, as were the potential risks (including bleeding, infection, nerve and/or blood vessel injury, persistent or recurrent pain, failure of the hardware, dislocation, stress fracture, need for further surgery, blood clots, strokes, heart attacks and/or arhythmias, pneumonia, etc.) and benefits. The patient states his understanding and wishes to proceed.   H&P reviewed and patient re-examined. No changes.

## 2021-11-10 NOTE — Anesthesia Postprocedure Evaluation (Signed)
Anesthesia Post Note  Patient: Dylan Chen  Procedure(s) Performed: REVERSE SHOULDER ARTHROPLASTY (Left: Shoulder)  Patient location during evaluation: PACU Anesthesia Type: Regional and General Level of consciousness: awake and alert, oriented and patient cooperative Pain management: pain level controlled Vital Signs Assessment: post-procedure vital signs reviewed and stable Respiratory status: spontaneous breathing, nonlabored ventilation and respiratory function stable Cardiovascular status: blood pressure returned to baseline and stable Postop Assessment: adequate PO intake Anesthetic complications: no   No notable events documented.   Last Vitals:  Vitals:   11/10/21 1311 11/10/21 1321  BP: 100/66 116/68  Pulse:  82  Resp:  16  Temp: (!) 36.2 C (!) 36.1 C  SpO2: 98% 96%    Last Pain:  Vitals:   11/10/21 1321  TempSrc: Temporal  PainSc: 2                  Darrin Nipper

## 2021-11-10 NOTE — Op Note (Signed)
11/10/2021  12:31 PM  Patient:   Dylan Chen  Pre-Op Diagnosis:   Massive recurrent rotator cuff tear, left shoulder.  Post-Op Diagnosis:   Same  Procedure:   Reverse left total shoulder arthroplasty.  Surgeon:   Pascal Lux, MD  Assistant:   Cameron Proud, PA-C  Anesthesia:   General endotracheal with an interscalene block using Exparel placed preoperatively by the anesthesiologist.  Findings:   As above.  Complications:   None  EBL:    125 cc  Fluids:   700 cc crystalloid  UOP:   None  TT:   None  Drains:   None  Closure:   Staples  Implants:   All press-fit Biomet Comprehensive system with a #9 Identity micro-humeral stem, a 40 mm standard humeral tray with a standard insert, and a mini-base plate with a 36 mm +3 mm lateralized glenosphere.  Brief Clinical Note:   The patient is a 66 year old male who is now 4 months status post a repair of a massive rotator cuff tear, including the subscapularis, supraspinatus, and infraspinatus tendons. The patient was doing well until he fell onto his shoulder 3 months postoperatively. A follow-up MRI scan confirmed the recurrence of a massive rotator cuff tear that involved the entire subscapularis, supraspinatus, and infraspinatus tendons. The patient presents at this time for a reverse left total shoulder arthroplasty.  Procedure:   The patient underwent placement of an interscalene block using Exparel by the anesthesiologist in the preoperative holding area before being brought into the operating room and lain in the supine position. The patient then underwent general endotracheal intubation and anesthesia before the patient was repositioned in the beach chair position using the beach chair positioner. The left shoulder and upper extremity were prepped with ChloraPrep solution before being draped sterilely. Preoperative antibiotics were administered. A timeout was performed to verify the appropriate surgical site.    A  standard anterior approach to the shoulder was made through an approximately 4-5 inch incision. The incision was carried down through the subcutaneous tissues to expose the deltopectoral fascia. The interval between the deltoid and pectoralis muscles was identified and this plane developed, retracting the cephalic vein laterally with the deltoid muscle. The conjoined tendon was identified. Its lateral margin was dissected and the Kolbel self-retraining retractor inserted. The "three sisters" were identified and cauterized. Bursal tissues were removed to improve visualization.   The anterior and inferior capsular tissues were released with care after identifying and protecting the axillary nerve. The proximal humeral cut was made at approximately 20-25 of retroversion using the extra-medullary guide.   Attention was redirected to the glenoid. The labrum was debrided circumferentially before the center of the glenoid was marked with electrocautery. The guidewire was drilled into the glenoid neck using the appropriate guide. After verifying its position, it was overreamed with the mini-baseplate reamer to create a flat surface. The permanent mini-baseplate was impacted into place. It was stabilized with a 35 x 6.5 mm central screw and four peripheral locking screws. The permanent 36 mm +3 mm lateralized glenosphere was then impacted into place and its Morse taper locking mechanism verified using manual distraction.  Attention was directed to the humeral side. The humeral canal was reamed sequentially beginning with the end-cutting reamer then progressing from a 4 mm reamer up to a 10 mm reamer. This provided excellent circumferential chatter. The canal was broached beginning with a #8 broach and progressing to a #10 broach. This was left in place and a trial  reduction performed using the standard trial humeral platform. This construct proved to be too tight, so the #10 broach was removed. The #9 broach was  inserted and countersunk. The proximal reamer was engaged to inset the humeral platform a repeat trial reduction was performed using the standard trial humeral platform with the +0 mm insert. The arm demonstrated excellent range of motion as the hand could be brought across the chest to the opposite shoulder and brought to the top of the patient's head and to the patient's ear. The shoulder appeared stable throughout this range of motion. The joint was dislocated and the trial components removed. The permanent #9 Identity micro-stem with the permanent standard humeral platform was put together on the back table before this construct was impacted into place with care taken to maintain the appropriate version. The permanent +0 mm insert was impacted into place. The shoulder was relocated using two finger pressure and again placed through a range of motion with the findings as described above.  The wound was copiously irrigated with sterile saline solution using the jet lavage system before a total of 20 cc of Exparel diluted out to 60 cc with normal saline and 30 cc of 0.5% Sensorcaine with epinephrine was injected into the pericapsular and peri-incisional tissues to help with postoperative analgesia. The subscapularis tendon was reapproximated using #2 FiberWire interrupted sutures. The deltopectoral interval was closed using #0 Vicryl interrupted sutures before the subcutaneous tissues were closed using 2-0 Vicryl interrupted sutures. The skin was closed using staples. Prior to closing the skin, 1 g of transexemic acid in 10 cc of normal saline was injected intra-articularly to help with postoperative bleeding. A sterile occlusive dressing was applied to the wound before the arm was placed into a shoulder immobilizer with an abduction pillow. A Polar Care system also was applied to the shoulder. The patient was then transferred back to a hospital bed before being awakened, extubated, and returned to the recovery  room in satisfactory condition after tolerating the procedure well.

## 2021-11-10 NOTE — Evaluation (Addendum)
Occupational Therapy Evaluation Patient Details Name: Dylan Chen MRN: [id number] DOB: December 08, 1955 Today's Date: 11/10/2021   History of Present Illness Pt is 66 y/o male s/p L reverse total shoulder arthroplasty.   Clinical Impression   Pt seated in recliner chair and agreeable to OT evaluation. Pt reports living at home with wife and grandson and being independent at baseline. Pt's wife is present for education and hands on training. OT reviewed pt's need precautions of NWB and no active movement of shoulder with pt and wife verbalizing understanding. OT also educating them on sling use of polar care.  Sling and polar care doffed and pt donning pull over shirt with min A and cuing for proper technique. Pt's wife was able to return demonstrations and assist pt with donning polar care and sling properly. Pt ambulating 120' without use of AD at supervision level. OT educating pt and family on hand/wrist/elbow exercises and able to return exercises with assist for elbow secondary to decreased sensation at this time. Pt and caregiver with no further questions at this time. Pt demonstrating ability to safely go home with wife who is able to assist as needed.      Recommendations for follow up therapy are one component of a multi-disciplinary discharge planning process, led by the attending physician.  Recommendations may be updated based on patient status, additional functional criteria and insurance authorization.   Follow Up Recommendations  Follow physician's recommendations for discharge plan and follow up therapies    Assistance Recommended at Discharge Intermittent Supervision/Assistance  Patient can return home with the following A little help with bathing/dressing/bathroom;Assistance with cooking/housework;Help with stairs or ramp for entrance;Assist for transportation    Functional Status Assessment  Patient has had a recent decline in their functional status and demonstrates the  ability to make significant improvements in function in a reasonable and predictable amount of time.  Equipment Recommendations  None recommended by OT       Precautions / Restrictions Precautions Precautions: Fall;Shoulder Shoulder Interventions: Timmothy Sours joy ultra sling;Shoulder abduction pillow;At all times;Off for dressing/bathing/exercises Precaution Booklet Issued: Yes (comment) Restrictions Weight Bearing Restrictions: Yes LUE Weight Bearing: Non weight bearing      Mobility Bed Mobility               General bed mobility comments: Pt seated in recliner chair    Transfers Overall transfer level: Needs assistance Equipment used: None Transfers: Sit to/from Stand Sit to Stand: Supervision           General transfer comment: increased effort but no physical assistance      Balance Overall balance assessment: Mild deficits observed, not formally tested                                         ADL either performed or assessed with clinical judgement   ADL Overall ADL's : Needs assistance/impaired Eating/Feeding: Independent;Sitting   Grooming: Wash/dry face;Set up;Standing           Upper Body Dressing : Minimal assistance Upper Body Dressing Details (indicate cue type and reason): min A to don pull over shirt and needing max A to don sling safely                 Functional mobility during ADLs: Supervision/safety       Vision Baseline Vision/History: 1 Wears glasses Ability to See in Adequate Light: 0 Adequate  Patient Visual Report: No change from baseline              Pertinent Vitals/Pain Pain Assessment Pain Assessment: No/denies pain     Hand Dominance Right   Extremity/Trunk Assessment Upper Extremity Assessment Upper Extremity Assessment: LUE deficits/detail LUE Deficits / Details: s/p reverse shoulder- not tested secondary to precautions   Lower Extremity Assessment Lower Extremity Assessment: Overall WFL  for tasks assessed       Communication Communication Communication: No difficulties   Cognition Arousal/Alertness: Awake/alert Behavior During Therapy: WFL for tasks assessed/performed Overall Cognitive Status: Within Functional Limits for tasks assessed                                 General Comments: Pt is pleasant and cooperative and motivated to return home     General Comments  Pt ambulating 125' with supervision and without use of AD            Home Living Family/patient expects to be discharged to:: Private residence Living Arrangements: Spouse/significant other;Children Available Help at Discharge: Family;Available 24 hours/day Type of Home: House Home Access: Stairs to enter [employer] of Steps: 4 Entrance Stairs-Rails: Right Home Layout: One level     Bathroom Shower/Tub: Tub/shower unit         Home Equipment: None          Prior Functioning/Environment Prior Level of Function : Independent/Modified Independent                        OT Problem List: Decreased strength;Decreased range of motion;Impaired sensation;Decreased activity tolerance;Decreased safety awareness;Impaired balance (sitting and/or standing);Decreased knowledge of use of DME or AE;Decreased knowledge of precautions;Impaired UE functional use      OT Treatment/Interventions: Self-care/ADL training;Therapeutic exercise;Therapeutic activities;DME and/or AE instruction;Manual therapy;Balance training;Patient/family education;Energy conservation    OT Goals(Current goals can be found in the care plan section) Acute Rehab OT Goals Patient Stated Goal: to go home OT Goal Formulation: With patient/family Time For Goal Achievement: 11/10/21 Potential to Achieve Goals: Good ADL Goals Pt Will Perform Grooming: with modified independence;standing Pt Will Perform Upper Body Dressing: with modified independence Pt Will Perform Lower Body Dressing: with  modified independence Pt Will Transfer to Toilet: with modified independence  OT Frequency: Min 2X/week       AM-PAC OT "6 Clicks" Daily Activity     Outcome Measure Help from another person eating meals?: None Help from another person taking care of personal grooming?: None Help from another person toileting, which includes using toliet, bedpan, or urinal?: A Little Help from another person bathing (including washing, rinsing, drying)?: A Little Help from another person to put on and taking off regular upper body clothing?: A Lot Help from another person to put on and taking off regular lower body clothing?: A Little 6 Click Score: 19   End of Session Equipment Utilized During Treatment: Other (comment) (sling and polar care) Nurse Communication: Mobility status  Activity Tolerance: Patient tolerated treatment well Patient left: in chair;with family/visitor present  OT Visit Diagnosis: Unsteadiness on feet (R26.81);Muscle weakness (generalized) (M62.81);History of falling (Z91.81)                Time: 1325-1400 OT Time Calculation (min): 35 min Charges:  OT General Charges $OT Visit: 1 Visit OT Evaluation $OT Eval Low Complexity: 1 Low OT Treatments $Self Care/Home Management : 23-37 mins  Darleen Crocker,  MS, OTR/L , CBIS ascom [phone]  11/10/21, 2:17 PM

## 2021-11-12 DIAGNOSIS — Z96612 Presence of left artificial shoulder joint: Secondary | ICD-10-CM | POA: Insufficient documentation

## 2021-11-12 LAB — SURGICAL PATHOLOGY

## 2022-01-11 ENCOUNTER — Other Ambulatory Visit: Payer: Self-pay

## 2022-01-12 ENCOUNTER — Ambulatory Visit: Payer: Medicare Other | Admitting: Gastroenterology

## 2022-01-12 ENCOUNTER — Encounter: Payer: Self-pay | Admitting: Gastroenterology

## 2022-01-12 VITALS — BP 132/73 | HR 53 | Temp 98.8°F | Ht 65.0 in | Wt 164.1 lb

## 2022-01-12 DIAGNOSIS — K640 First degree hemorrhoids: Secondary | ICD-10-CM

## 2022-01-12 DIAGNOSIS — K625 Hemorrhage of anus and rectum: Secondary | ICD-10-CM

## 2022-01-12 NOTE — Progress Notes (Signed)
PROCEDURE NOTE: ?The patient presents with symptomatic grade 1 hemorrhoids, unresponsive to maximal medical therapy, requesting rubber band ligation of his/her hemorrhoidal disease.  All risks, benefits and alternative forms of therapy were described and informed consent was obtained. ? ?Patient reports that his rectal bleeding has significantly improved since first banding ? ?The decision was made to band the RA internal hemorrhoid, and the York O?Regan System was used to perform band ligation without complication.  Digital anorectal examination was then performed to assure proper positioning of the band, and to adjust the banded tissue as required.  The patient was discharged home without pain or other issues.  Dietary and behavioral recommendations were given and (if necessary - prescriptions were given), along with follow-up instructions.  The patient will return  as needed for follow-up and possible additional banding as required. ? ?No complications were encountered and the patient tolerated the procedure well. ? ? ?

## 2022-05-10 ENCOUNTER — Other Ambulatory Visit: Payer: Self-pay | Admitting: Student

## 2022-05-10 DIAGNOSIS — G3184 Mild cognitive impairment, so stated: Secondary | ICD-10-CM

## 2022-05-21 ENCOUNTER — Ambulatory Visit
Admission: RE | Admit: 2022-05-21 | Discharge: 2022-05-21 | Disposition: A | Discharge status: Home / Self Care | Payer: Medicare Other | Source: Ambulatory Visit | Attending: Student | Admitting: Student

## 2022-05-21 DIAGNOSIS — G3184 Mild cognitive impairment, so stated: Secondary | ICD-10-CM

## 2022-12-16 IMAGING — CR DG KNEE 1-2V*R*
2 series · 2 of 2 positions shown · non-contrast
Comparison: None.

CLINICAL DATA: Pain following fall

EXAM:
RIGHT KNEE - 1-2 VIEW

[knee ap]
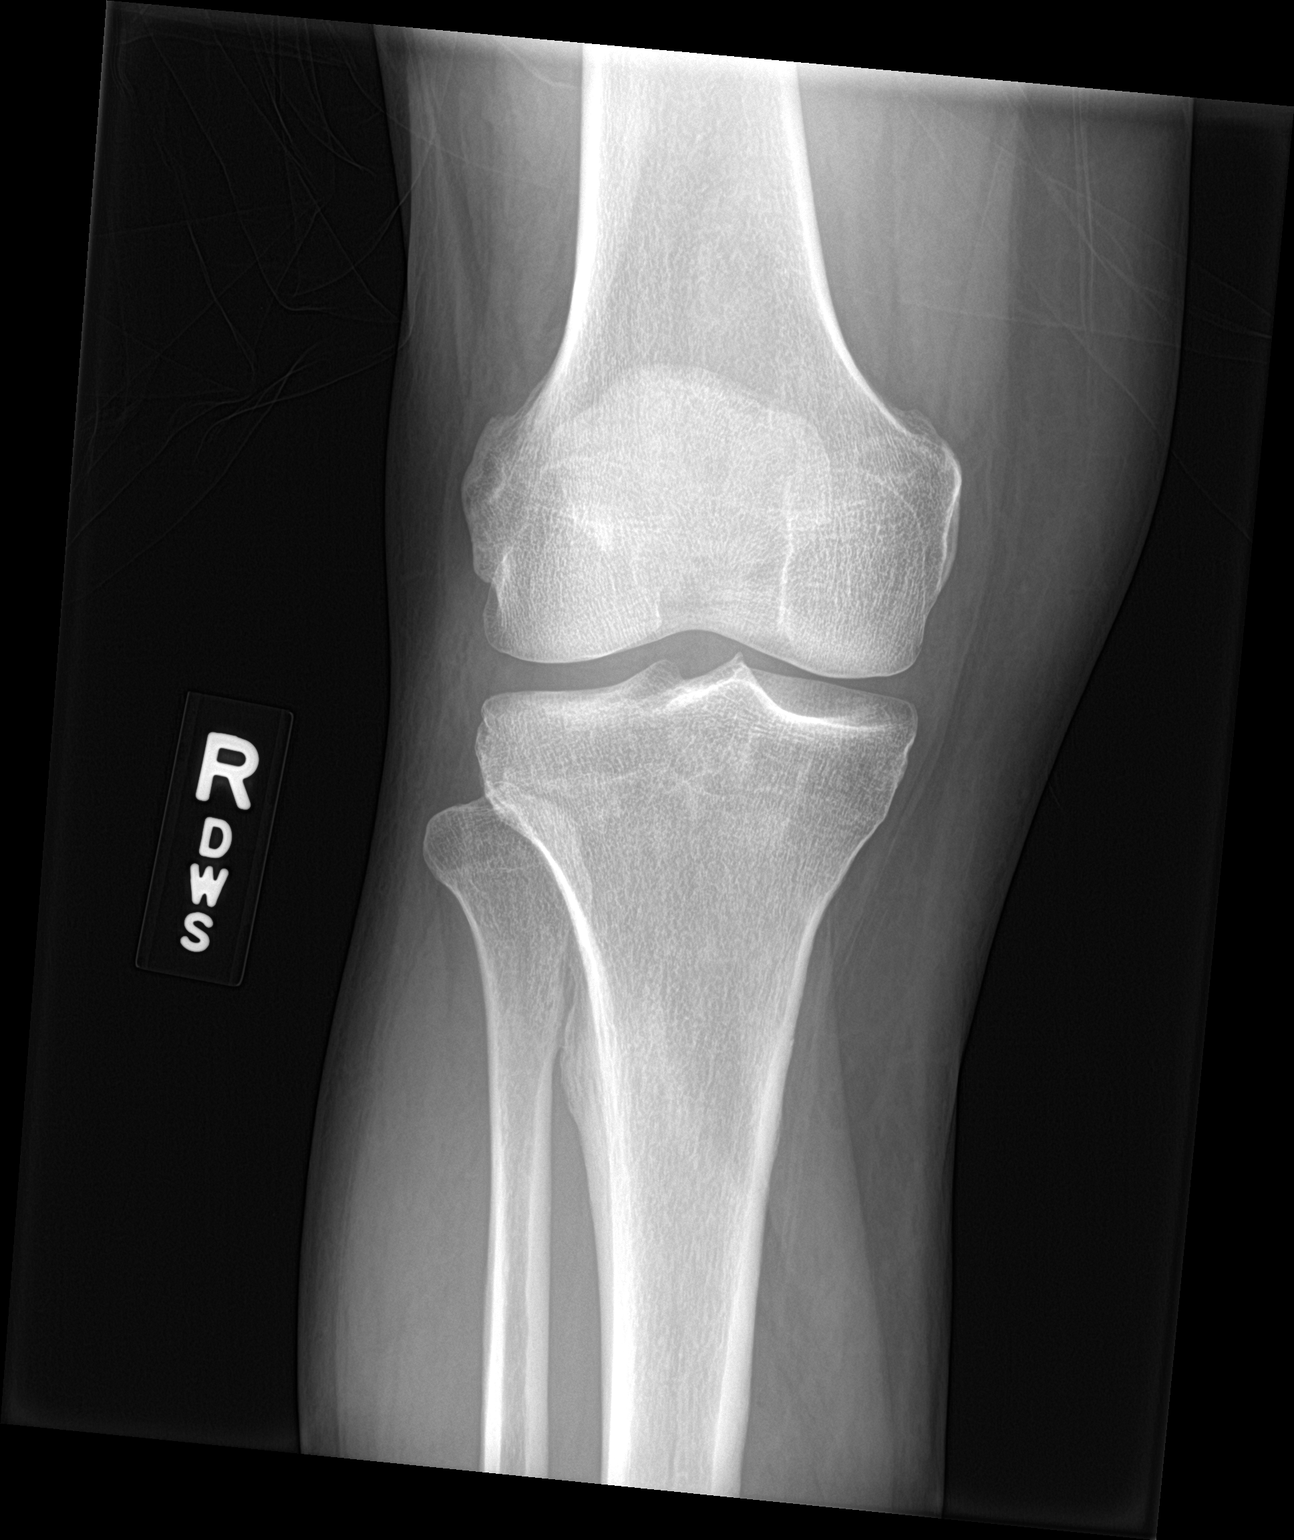

[knee lat]
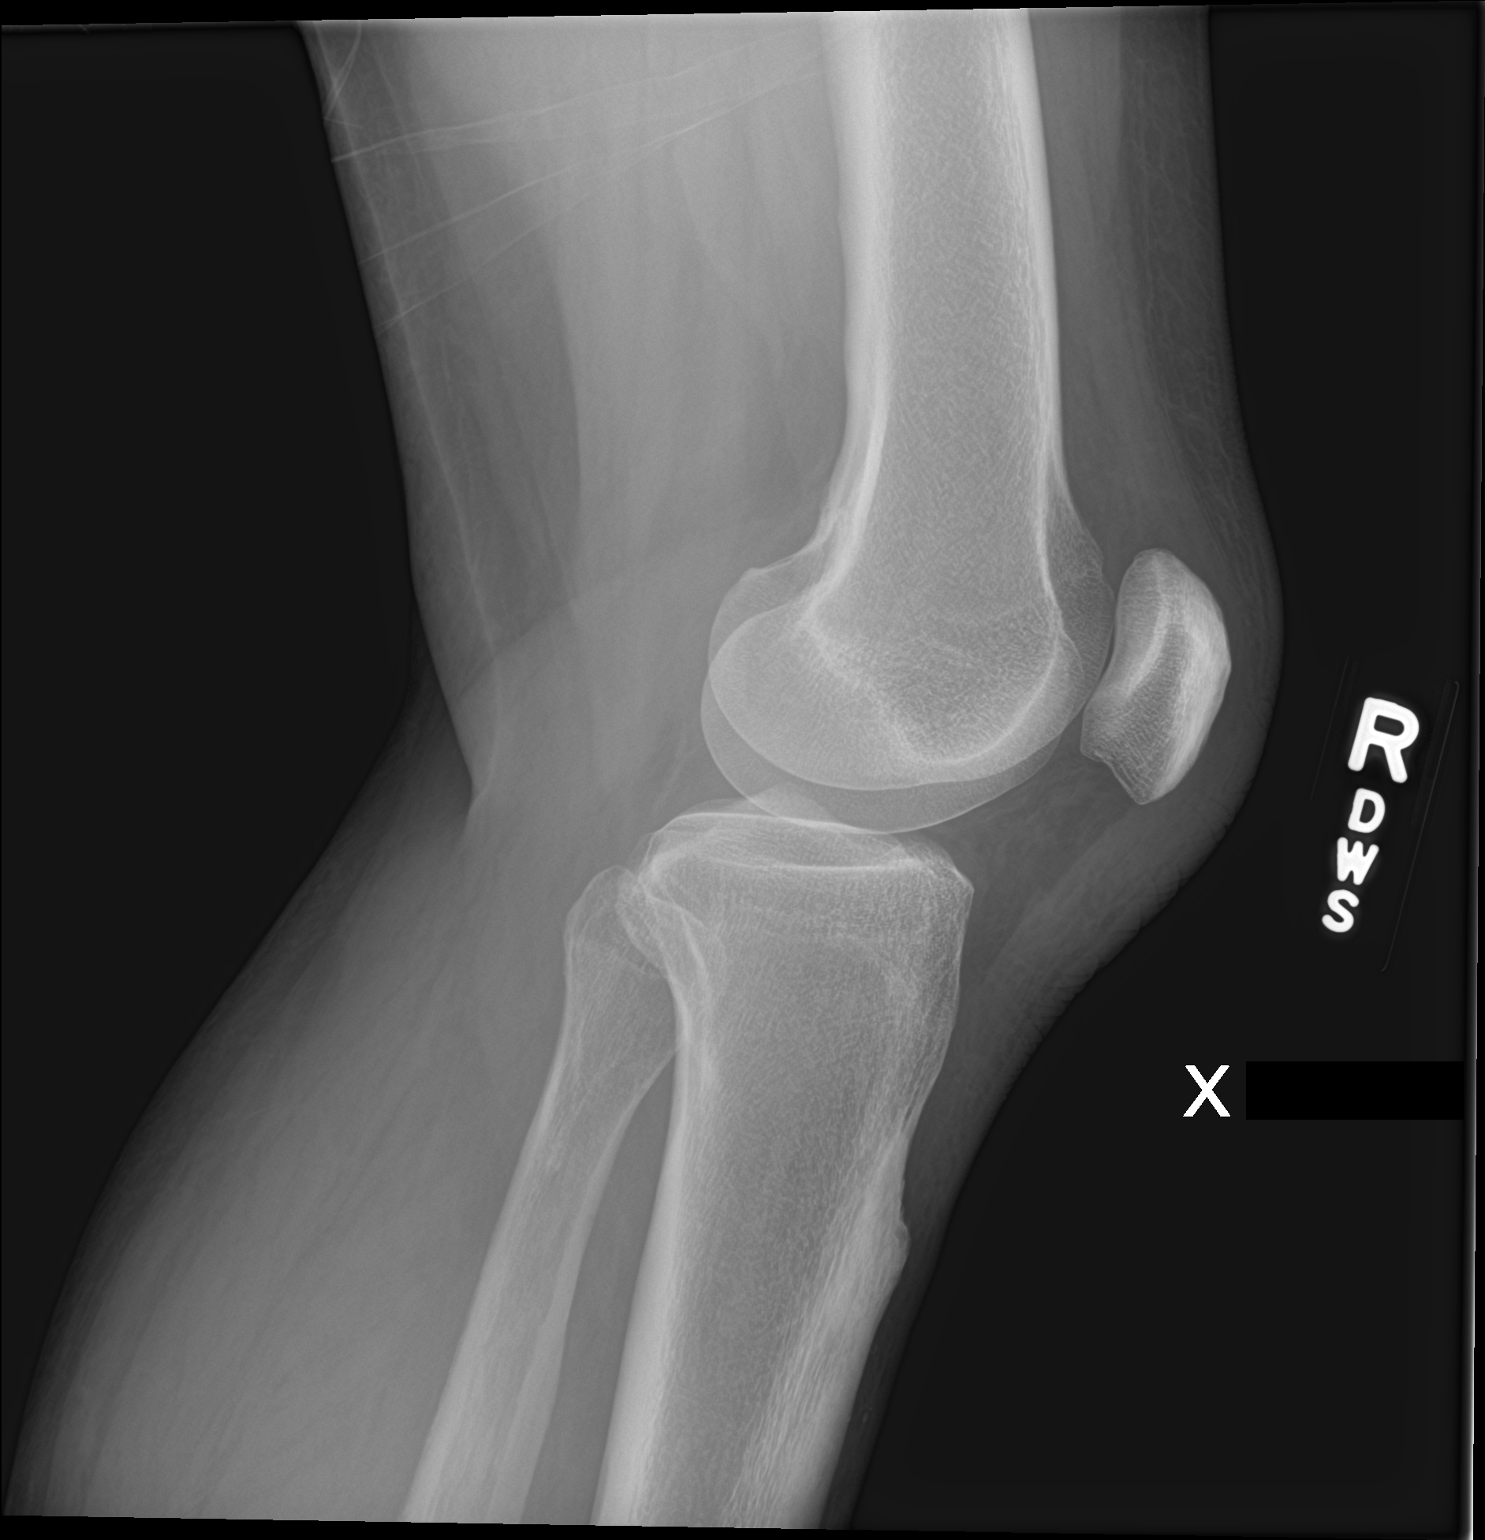

[2 of 2 positions shown; findings below may reference images not displayed]

FINDINGS: Frontal and lateral views were obtained. No fracture or dislocation.
No joint effusion. No joint space narrowing or erosion.
IMPRESSION: No fracture, dislocation, or joint effusion. No evident arthropathy.

## 2023-01-16 ENCOUNTER — Emergency Department
Admission: EM | Admit: 2023-01-16 | Discharge: 2023-01-16 | Disposition: A | Discharge status: Home / Self Care | Payer: Medicare Other | Attending: Emergency Medicine | Admitting: Emergency Medicine

## 2023-01-16 ENCOUNTER — Other Ambulatory Visit: Payer: Self-pay

## 2023-01-16 ENCOUNTER — Emergency Department: Payer: Medicare Other

## 2023-01-16 DIAGNOSIS — R42 Dizziness and giddiness: Secondary | ICD-10-CM | POA: Insufficient documentation

## 2023-01-16 LAB — BASIC METABOLIC PANEL
Anion gap: 8 (ref 5–15)
BUN: 15 mg/dL (ref 8–23)
CO2: 28 mmol/L (ref 22–32)
Calcium: 9.3 mg/dL (ref 8.9–10.3)
Chloride: 102 mmol/L (ref 98–111)
Creatinine, Ser: 0.88 mg/dL (ref 0.61–1.24)
GFR, Estimated: >60 mL/min (ref >60)
Glucose, Bld: 97 mg/dL (ref 70–99)
Potassium: 4.4 mmol/L (ref 3.5–5.1)
Sodium: 138 mmol/L (ref 135–145)

## 2023-01-16 LAB — CBC
HCT: 47.8 % (ref 39.0–52.0)
Hemoglobin: 16.6 g/dL (ref 13.0–17.0)
MCH: 32.6 pg (ref 26.0–34.0)
MCHC: 34.7 g/dL (ref 30.0–36.0)
MCV: 93.9 fL (ref 80.0–100.0)
Platelets: 224 10*3/uL (ref 150–400)
RBC: 5.09 MIL/uL (ref 4.22–5.81)
RDW: 11.5 % (ref 11.5–15.5)
WBC: 4.7 10*3/uL (ref 4.0–10.5)
nRBC: 0 % (ref 0.0–0.2)

## 2023-01-16 LAB — TROPONIN I (HIGH SENSITIVITY)
Troponin I (High Sensitivity): 4 ng/L (ref <18)
Troponin I (High Sensitivity): 4 ng/L (ref <18)

## 2023-01-16 MED ORDER — MECLIZINE HCL 25 MG PO TABS
25.0000 mg | ORAL_TABLET | Freq: Once | ORAL | Status: AC
Start: 2023-01-16 — End: 2023-01-16
  Administered 2023-01-16: 25 mg via ORAL
  Filled 2023-01-16: qty 1

## 2023-01-16 MED ORDER — DIAZEPAM 2 MG PO TABS
2.0000 mg | ORAL_TABLET | Freq: Once | ORAL | Status: AC
  Administered 2023-01-16: 2 mg via ORAL
  Filled 2023-01-16: qty 1

## 2023-01-16 MED ORDER — DIAZEPAM 2 MG PO TABS: 2.0000 mg | ORAL_TABLET | Freq: Three times a day (TID) | ORAL | 0 refills | Status: AC | PRN

## 2023-01-16 NOTE — Discharge Instructions (Signed)
Please seek medical attention for any high fevers, chest pain, shortness of breath, change in behavior, persistent vomiting, bloody stool or any other new or concerning symptoms.  

## 2023-01-16 NOTE — ED Triage Notes (Signed)
Pt to ED with wife POV for dizziness since Tuesday. Pt had fall on Tuesday after became dizzy. No thinners, no LOC or head trauma. Pt has been increasingly dizzy since then and then yesterday was having a harder time walking by self. Pt gets dizzy more with sitting up or standing up and worse with movement. Denies CP, SOB. Denies unilateral weakness. Denies vision changes. Skin warm and dry.

## 2023-01-16 NOTE — ED Provider Notes (Signed)
[facility] Provider Note    Event Date/Time   First MD Initiated Contact with Patient 01/16/23 1616     (approximate)   History   Dizziness   HPI  KEHINDE PAREKH is a 67 y.o. male  who presents to the emergency department today because of concern for dizziness. First episode was 5 days ago. It did cause a fall. Denies any trauma to his head prior to the dizziness starting.  The patient states that time the dizziness and seem to gotten worse.  It is worse with movement and standing up.  He denies any headache.  Denies similar symptoms in the past.  His doctor did recently decrease his dose of Synthroid.     Physical Exam   Triage Vital Signs: ED Triage Vitals  Enc Vitals Group     BP 01/16/23 1444 123/83     Pulse Rate 01/16/23 1444 (!) 52     Resp 01/16/23 1444 15     Temp 01/16/23 1448 98.1 F (36.7 C)     Temp Source 01/16/23 1444 Oral     SpO2 01/16/23 1444 96 %     Weight 01/16/23 1445 182 lb (82.6 kg)     Height 01/16/23 1445 5\' 2"  (1.575 m)     Head Circumference --      Peak Flow --      Pain Score 01/16/23 1445 0     Pain Loc --      Pain Edu? --      Excl. in GC? --     Most recent vital signs: Vitals:   01/16/23 1448 01/16/23 1620  BP:  135/77  Pulse:  (!) 54  Resp:  16  Temp: 98.1 F (36.7 C)   SpO2:  96%   General: Awake, alert, oriented. CV:  Good peripheral perfusion. Bradycardia. Resp:  Normal effort. Regular rate and rhythm. Abd:  No distention. Non tender. Other:  Face symmetric. PERRL. EOMI. Strength 5/5 in upper and lower extremities. Sensation grossly intact.    ED Results / Procedures / Treatments   Labs (all labs ordered are listed, but only abnormal results are displayed) Labs Reviewed  BASIC METABOLIC PANEL  CBC  TROPONIN I (HIGH SENSITIVITY)  TROPONIN I (HIGH SENSITIVITY)     EKG  I, Phineas Semen, attending physician, personally viewed and interpreted this EKG  EKG Time: 1457 Rate:  48 Rhythm: sinus bradycardia Axis: normal Intervals: qtc 398 QRS: narrow ST changes: no st elevation Impression: abnormal ekg    RADIOLOGY I independently interpreted and visualized the MR brain. My interpretation: No large stroke Radiology interpretation:  IMPRESSION:  1. No acute intracranial abnormality.  2. 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     PROCEDURES:  Critical Care performed: No    MEDICATIONS ORDERED IN ED: Medications - No data to display   IMPRESSION / MDM / ASSESSMENT AND PLAN / ED COURSE  I reviewed the triage vital signs and the nursing notes.                              Differential diagnosis includes, but is not limited to, BPPV, CVA, anemia, electrolyte abnormality.  Patient's presentation is most consistent with acute presentation with potential threat to life or bodily function.   The patient is on the cardiac monitor to evaluate for evidence of arrhythmia and/or significant heart rate changes.  Patient presented  to the emergency department today because of concerns for dizziness.  First episode 5 days ago.  On exam no focal neurodeficits.  Given clinical history of his symptoms being worse with movement I do have high suspicion for vertigo.  Will however check MRI to evaluate for possible CVA.  Will trial meclizine.  MRI without evidence of CVA. Patient without significant relief with meclizine. Did give valium which helped with patient's symptoms. At this time do think vertigo likely. Will plan on discharging and will give patient ENT follow up information.      FINAL CLINICAL IMPRESSION(S) / ED DIAGNOSES   Final diagnoses:  Vertigo     Note:  This document was prepared using Dragon voice recognition software and may include unintentional dictation errors.    Phineas Semen, MD 01/16/23 2125

## 2023-04-08 IMAGING — MR MR SHOULDER*L* W/O CM
5 series · 40 of 40 positions shown · non-contrast
Comparison: None.

CLINICAL DATA: Left shoulder pain and rotator cuff dysfunction.

EXAM:
MRI OF THE LEFT SHOULDER WITHOUT CONTRAST
TECHNIQUE: Multiplanar, multisequence MR imaging of the shoulder was performed.
No intravenous contrast was administered.

[Series 7: T2 fat-sat · axial · 4.0mm · 0.59mm/px · z∈[-15,+71]mm · 6 of 20 slices shown (1 of 3)]
[im 1/20]
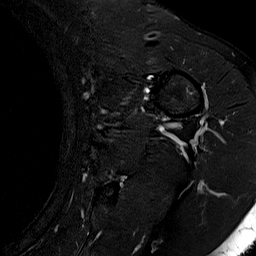
[im 4/20]
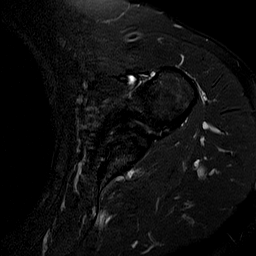
[im 8/20]
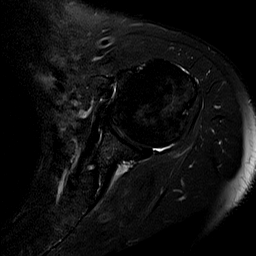
[im 12/20]
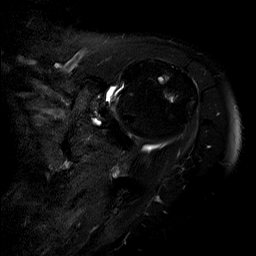
[im 16/20]
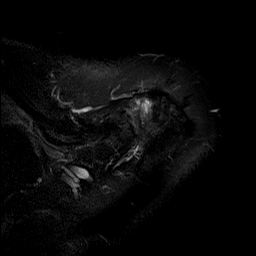
[im 20/20]
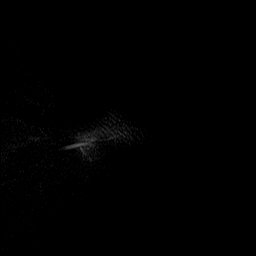

[Series 8: T2 fat-sat · oblique · 4.0mm · 0.59mm/px · 8 of 21 slices shown (2 of 3)]
[im 1/21]
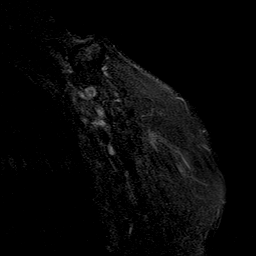
[im 3/21]
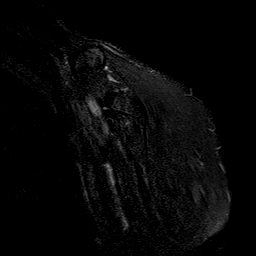
[im 6/21]
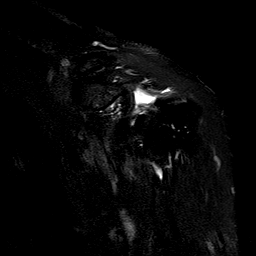
[im 9/21]
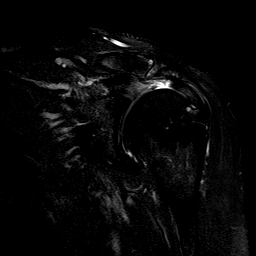
[im 12/21]
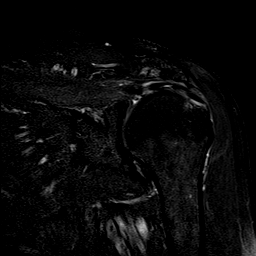
[im 15/21]
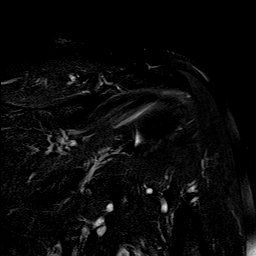
[im 18/21]
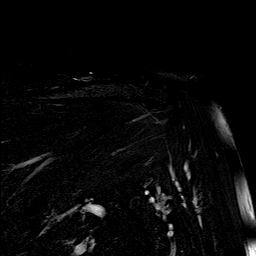
[im 21/21]
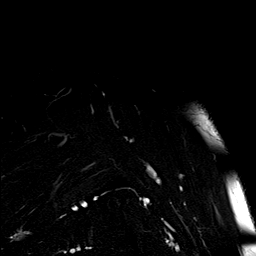

[Series 9: PD · oblique · 4.0mm · 0.59mm/px · 8 of 21 slices shown]
[im 1/21]
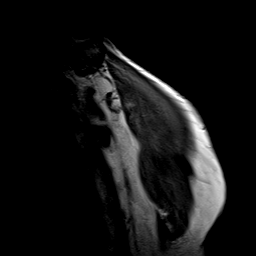
[im 3/21]
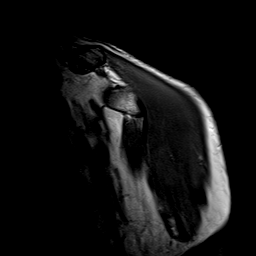
[im 6/21]
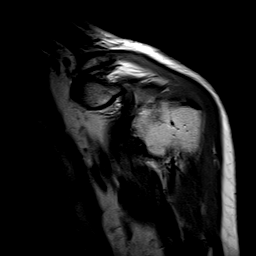
[im 9/21]
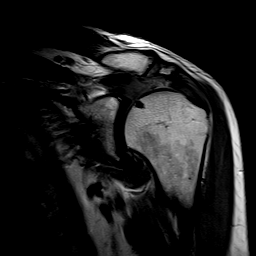
[im 12/21]
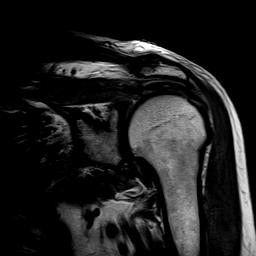
[im 15/21]
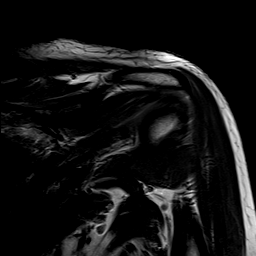
[im 18/21]
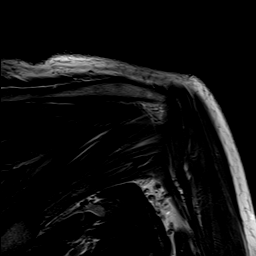
[im 21/21]
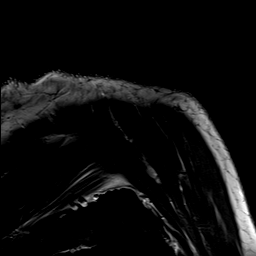

[Series 10: T1 · oblique · 4.0mm · 0.59mm/px · 9 of 23 slices shown]
[im 1/23]
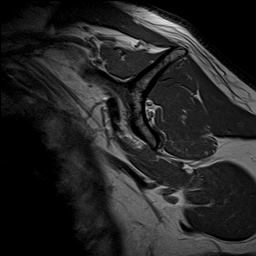
[im 3/23]
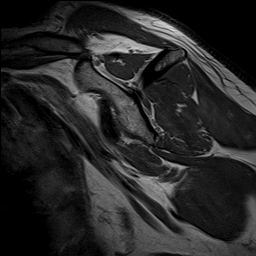
[im 6/23]
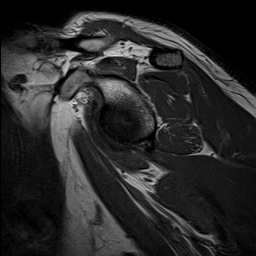
[im 9/23]
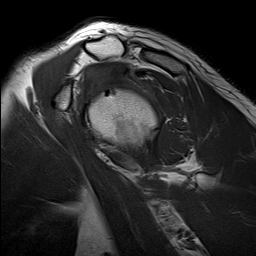
[im 12/23]
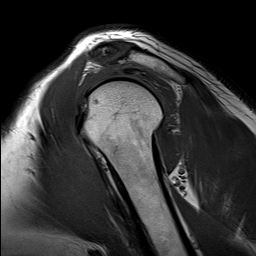
[im 14/23]
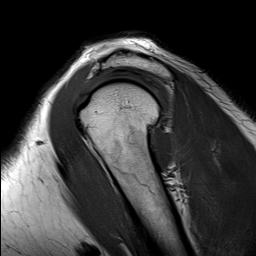
[im 17/23]
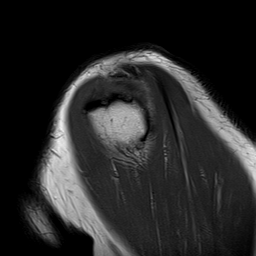
[im 20/23]
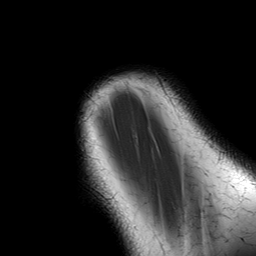
[im 23/23]
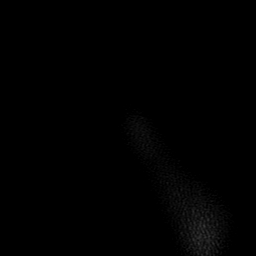

[Series 11: T2 fat-sat · oblique · 4.0mm · 0.59mm/px · 9 of 23 slices shown (3 of 3)]
[im 1/23]
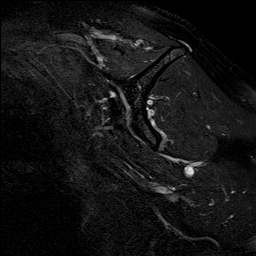
[im 3/23]
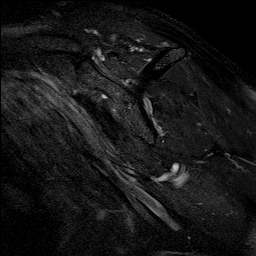
[im 6/23]
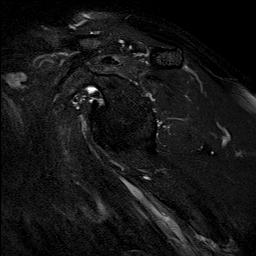
[im 9/23]
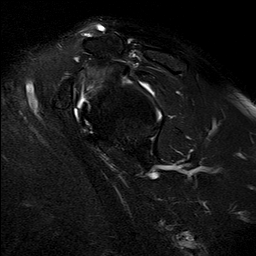
[im 12/23]
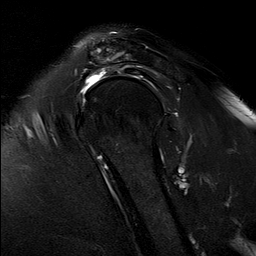
[im 14/23]
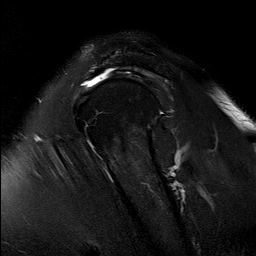
[im 17/23]
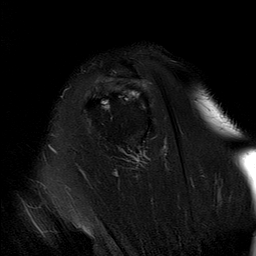
[im 20/23]
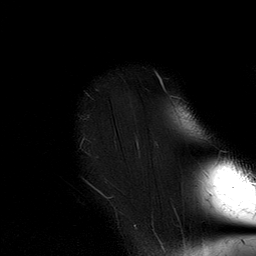
[im 23/23]
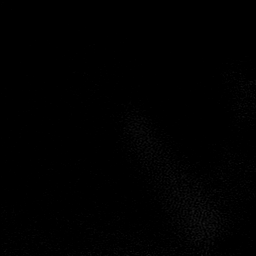

[40 of 40 positions shown; findings below may reference images not displayed]

FINDINGS: Rotator cuff: Full-thickness full width rupture of the supraspinatus
tendon. The anterior portion of the tendon is retracted back to the
plane of the glenoid with the posterior portion of the tendon
retracted back 2.7 cm from the attachment site.

Substantial partial thickness articular surface tearing of the
subscapularis tendon noted with mild distal tendinopathy. Mild
infraspinatus tendinopathy.

Muscles: Mild infraspinatus muscular atrophy. No supraspinatus
muscular atrophy or muscular edema currently identified.

Biceps long head: Nonvisualization of the intra-articular segment,
compatible with tear.

Acromioclavicular Joint: Mild spurring and mild degenerative
subcortical marrow edema compatible with mild degenerative AC joint
arthropathy. Type II acromion. There is a small amount of fluid in
the subacromial subdeltoid bursa communicating with the glenohumeral
joint.

Glenohumeral Joint: Moderate degenerative chondral thinning. Mild
spurring of the humeral head.

Labrum:  Grossly unremarkable

Bones: No significant extra-articular osseous abnormalities
identified.

Other: No supplemental non-categorized findings.
IMPRESSION: 1. Full-thickness full width rupture of the supraspinatus tendon
with variable retraction. The posterior portion of the tendon is
less retracted with only 2.7 cm retraction. No supraspinatus
muscular edema or atrophy.
2. Substantial partial thickness articular surface tearing of much
of the subscapularis tendon mild distal tendinopathy and mild
muscular atrophy.
3. Mild infraspinatus tendinopathy distally.
4. Mild degenerative AC joint arthropathy and moderate degenerative
chondral thinning in the glenohumeral joint.

## 2023-04-15 ENCOUNTER — Other Ambulatory Visit: Payer: Self-pay | Admitting: Student

## 2023-04-15 DIAGNOSIS — M542 Cervicalgia: Secondary | ICD-10-CM

## 2023-04-18 ENCOUNTER — Ambulatory Visit
Admission: RE | Admit: 2023-04-18 | Discharge: 2023-04-18 | Disposition: A | Discharge status: Home / Self Care | Payer: Medicare Other | Source: Ambulatory Visit | Attending: Student | Admitting: Student

## 2023-04-18 DIAGNOSIS — M542 Cervicalgia: Secondary | ICD-10-CM | POA: Insufficient documentation

## 2023-04-22 ENCOUNTER — Other Ambulatory Visit: Payer: Self-pay | Admitting: Student

## 2023-04-22 DIAGNOSIS — M542 Cervicalgia: Secondary | ICD-10-CM

## 2023-04-28 ENCOUNTER — Ambulatory Visit
Admission: RE | Admit: 2023-04-28 | Discharge: 2023-04-28 | Disposition: A | Discharge status: Home / Self Care | Payer: Medicare Other | Source: Ambulatory Visit | Attending: Student | Admitting: Student

## 2023-04-28 DIAGNOSIS — M542 Cervicalgia: Secondary | ICD-10-CM | POA: Diagnosis not present
